# Patient Record
Sex: Male | Born: 1985 | Hispanic: No | Marital: Single | Smoking: Heavy tobacco smoker
Health system: Southern US, Community
[De-identification: ages and names within clinical notes are randomized; demographics above are authoritative.]

## PROBLEM LIST (undated history)

## (undated) DIAGNOSIS — F4001 Agoraphobia with panic disorder: Secondary | ICD-10-CM

## (undated) DIAGNOSIS — F419 Anxiety disorder, unspecified: Secondary | ICD-10-CM

## (undated) DIAGNOSIS — L409 Psoriasis, unspecified: Secondary | ICD-10-CM

## (undated) DIAGNOSIS — I10 Essential (primary) hypertension: Secondary | ICD-10-CM

## (undated) HISTORY — DX: Agoraphobia with panic disorder: F40.01

## (undated) HISTORY — DX: Psoriasis, unspecified: L40.9

## (undated) HISTORY — DX: Anxiety disorder, unspecified: F41.9

## (undated) HISTORY — DX: Essential (primary) hypertension: I10

---

## 2007-10-28 DIAGNOSIS — F4001 Agoraphobia with panic disorder: Secondary | ICD-10-CM

## 2007-10-28 HISTORY — DX: Agoraphobia with panic disorder: F40.01

## 2010-01-29 ENCOUNTER — Encounter: Admission: RE | Admit: 2010-01-29 | Discharge: 2010-01-29 | Payer: Self-pay | Admitting: Neurosurgery
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
## 2010-10-03 ENCOUNTER — Inpatient Hospital Stay (HOSPITAL_COMMUNITY): Admission: AC | Admit: 2010-10-03 | Discharge: 2010-01-04 | Payer: Self-pay

## 2011-01-20 LAB — CBC
HCT: 40.9 % (ref 39.0–52.0)
Hemoglobin: 14.7 g/dL (ref 13.0–17.0)
Hemoglobin: 16.3 g/dL (ref 13.0–17.0)
MCHC: 33.9 g/dL (ref 30.0–36.0)
MCHC: 34.7 g/dL (ref 30.0–36.0)
MCV: 89.9 fL (ref 78.0–100.0)
MCV: 90 fL (ref 78.0–100.0)
MCV: 90.6 fL (ref 78.0–100.0)
RBC: 4.51 MIL/uL (ref 4.22–5.81)
RBC: 4.83 MIL/uL (ref 4.22–5.81)
RDW: 13.4 % (ref 11.5–15.5)
RDW: 13.8 % (ref 11.5–15.5)
WBC: 10.5 10*3/uL (ref 4.0–10.5)
WBC: 11.6 10*3/uL — ABNORMAL HIGH (ref 4.0–10.5)

## 2011-01-20 LAB — CROSSMATCH

## 2011-01-20 LAB — COMPREHENSIVE METABOLIC PANEL
ALT: 20 U/L (ref 0–53)
Alkaline Phosphatase: 78 U/L (ref 39–117)
Chloride: 103 meq/L (ref 96–112)
Creatinine, Ser: 1.01 mg/dL (ref 0.4–1.5)
GFR calc non Af Amer: >60 mL/min (ref >60)
Potassium: 3.4 meq/L — ABNORMAL LOW (ref 3.5–5.1)
Sodium: 139 meq/L (ref 135–145)
Total Bilirubin: 0.3 mg/dL (ref 0.3–1.2)
Total Protein: 6.6 g/dL (ref 6.0–8.3)

## 2011-01-20 LAB — POCT I-STAT, CHEM 8
Calcium, Ion: 1.09 mmol/L — ABNORMAL LOW (ref 1.12–1.32)
Chloride: 105 meq/L (ref 96–112)
Glucose, Bld: 125 mg/dL — ABNORMAL HIGH (ref 70–99)
Hemoglobin: 16.3 g/dL (ref 13.0–17.0)
Sodium: 140 meq/L (ref 135–145)

## 2011-01-20 LAB — URINALYSIS, ROUTINE W REFLEX MICROSCOPIC
Bilirubin Urine: NEGATIVE
Hgb urine dipstick: NEGATIVE
Nitrite: NEGATIVE
pH: 6 (ref 5.0–8.0)

## 2011-01-20 LAB — BASIC METABOLIC PANEL
CO2: 27 mEq/L (ref 19–32)
Chloride: 106 mEq/L (ref 96–112)
Chloride: 107 mEq/L (ref 96–112)
Creatinine, Ser: 0.84 mg/dL (ref 0.4–1.5)
GFR calc Af Amer: >60 mL/min (ref >60)
GFR calc non Af Amer: >60 mL/min (ref >60)
GFR calc non Af Amer: >60 mL/min (ref >60)
Glucose, Bld: 112 mg/dL — ABNORMAL HIGH (ref 70–99)
Glucose, Bld: 134 mg/dL — ABNORMAL HIGH (ref 70–99)
Potassium: 3.9 mEq/L (ref 3.5–5.1)

## 2011-01-20 LAB — RAPID URINE DRUG SCREEN, HOSP PERFORMED
Benzodiazepines: POSITIVE — AB
Cocaine: NOT DETECTED
Opiates: NOT DETECTED

## 2011-01-20 LAB — URINE CULTURE: Colony Count: NO GROWTH

## 2011-01-20 LAB — DIFFERENTIAL
Eosinophils Absolute: 0 10*3/uL (ref 0.0–0.7)
Lymphs Abs: 0.8 10*3/uL (ref 0.7–4.0)
Monocytes Relative: 8 % (ref 3–12)
Neutrophils Relative %: 85 % — ABNORMAL HIGH (ref 43–77)

## 2011-01-20 LAB — PHENYTOIN LEVEL, TOTAL: Phenytoin Lvl: 17.7 ug/mL (ref 10.0–20.0)

## 2011-01-20 LAB — PROTIME-INR: Prothrombin Time: 13 s (ref 11.6–15.2)

## 2011-01-20 LAB — MRSA PCR SCREENING

## 2013-03-22 ENCOUNTER — Other Ambulatory Visit: Payer: Self-pay | Admitting: Nurse Practitioner

## 2013-03-23 NOTE — Telephone Encounter (Signed)
Last seen 12/16/12, last filled this day also, no future appts. Call into [facility] if approved

## 2013-03-23 NOTE — Telephone Encounter (Signed)
rx CALLED TO [facility]. 

## 2013-03-23 NOTE — Telephone Encounter (Signed)
Please call in xanax rx 

## 2013-03-26 ENCOUNTER — Other Ambulatory Visit: Payer: Self-pay | Admitting: Nurse Practitioner

## 2013-03-31 ENCOUNTER — Other Ambulatory Visit: Payer: Self-pay | Admitting: Nurse Practitioner

## 2013-05-12 ENCOUNTER — Other Ambulatory Visit: Payer: Self-pay | Admitting: Nurse Practitioner

## 2013-06-10 ENCOUNTER — Other Ambulatory Visit: Payer: Self-pay | Admitting: Nurse Practitioner

## 2013-06-13 NOTE — Telephone Encounter (Signed)
Filled 05/12/13  MMM Told needed to be seen before next refill  No upcoming appt scheduled

## 2013-06-15 ENCOUNTER — Other Ambulatory Visit: Payer: Self-pay | Admitting: Nurse Practitioner

## 2013-06-16 NOTE — Telephone Encounter (Signed)
Last seen 12/28/12  ACM

## 2013-08-24 ENCOUNTER — Other Ambulatory Visit: Payer: Self-pay | Admitting: Nurse Practitioner

## 2013-08-26 ENCOUNTER — Telehealth: Payer: Self-pay | Admitting: Nurse Practitioner

## 2013-08-26 MED ORDER — PAROXETINE HCL 40 MG PO TABS: 40.0000 mg | ORAL_TABLET | Freq: Every day | ORAL | Status: DC

## 2013-08-26 NOTE — Telephone Encounter (Signed)
done

## 2013-08-30 ENCOUNTER — Ambulatory Visit: Payer: Self-pay | Admitting: Family Medicine

## 2013-09-02 ENCOUNTER — Ambulatory Visit: Payer: Self-pay | Admitting: Family Medicine

## 2013-10-18 ENCOUNTER — Telehealth: Payer: Self-pay | Admitting: Nurse Practitioner

## 2013-10-19 MED ORDER — PAROXETINE HCL 40 MG PO TABS: 40.0000 mg | ORAL_TABLET | Freq: Every day | ORAL | Status: DC

## 2013-12-08 ENCOUNTER — Other Ambulatory Visit: Payer: Self-pay | Admitting: Nurse Practitioner

## 2013-12-08 NOTE — Telephone Encounter (Signed)
Refill

## 2013-12-09 NOTE — Telephone Encounter (Signed)
No visit in Epic. Please advise

## 2013-12-11 NOTE — Telephone Encounter (Signed)
ntbs for future refills 

## 2013-12-12 NOTE — Telephone Encounter (Signed)
Message left that patient ntbs

## 2014-02-03 ENCOUNTER — Other Ambulatory Visit: Payer: Self-pay | Admitting: *Deleted

## 2014-02-03 MED ORDER — PAROXETINE HCL 40 MG PO TABS
ORAL_TABLET | ORAL | Status: DC
Start: 2014-02-03 — End: 2014-03-21

## 2014-02-03 NOTE — Telephone Encounter (Signed)
Last OV on 12-28-12. Please advise

## 2014-02-05 ENCOUNTER — Other Ambulatory Visit: Payer: Self-pay | Admitting: Family Medicine

## 2014-03-20 ENCOUNTER — Telehealth: Payer: Self-pay | Admitting: *Deleted

## 2014-03-21 MED ORDER — PAROXETINE HCL 40 MG PO TABS
40.0000 mg | ORAL_TABLET | ORAL | Status: DC
Start: 2014-03-21 — End: 2014-03-24

## 2014-03-21 NOTE — Telephone Encounter (Signed)
Hasn't been seen in over one year. He has been out of Paxil for 2 days and is starting to feel the side effects of missing it. He has been stretching it out to make it last.  Patient does not have insurance and isn't sure how he'll pay for the visit. Discussed payment options with billing and with a 50% discount the cost will be $80. The only other option is for him to see how quickly the health department can see him since they have a sliding fee scale.  Patient was agreeable to the payment plan. I will send in 2 weeks worth of Paxil. Appt scheduled for 03/24/14. Patient understands that there will be no more negotiating refills if he is unable to be seen.

## 2014-03-24 ENCOUNTER — Ambulatory Visit (INDEPENDENT_AMBULATORY_CARE_PROVIDER_SITE_OTHER): Payer: Self-pay | Admitting: Family

## 2014-03-24 ENCOUNTER — Encounter: Payer: Self-pay | Admitting: Family

## 2014-03-24 VITALS — BP 139/91 | HR 96 | Temp 99.0°F | Ht 72.0 in | Wt 295.8 lb

## 2014-03-24 DIAGNOSIS — F32A Depression, unspecified: Secondary | ICD-10-CM

## 2014-03-24 DIAGNOSIS — F411 Generalized anxiety disorder: Secondary | ICD-10-CM | POA: Insufficient documentation

## 2014-03-24 DIAGNOSIS — F329 Major depressive disorder, single episode, unspecified: Secondary | ICD-10-CM | POA: Insufficient documentation

## 2014-03-24 DIAGNOSIS — F3289 Other specified depressive episodes: Secondary | ICD-10-CM

## 2014-03-24 MED ORDER — PAROXETINE HCL 40 MG PO TABS: 40.0000 mg | ORAL_TABLET | ORAL | Status: DC

## 2014-03-24 MED ORDER — ALPRAZOLAM 0.5 MG PO TABS: 0.5000 mg | ORAL_TABLET | Freq: Two times a day (BID) | ORAL | Status: DC | PRN

## 2014-03-24 MED ORDER — ALPRAZOLAM 0.5 MG PO TABS
0.5000 mg | ORAL_TABLET | Freq: Two times a day (BID) | ORAL | Status: DC | PRN
Start: 2014-03-24 — End: 2014-03-24

## 2014-03-24 NOTE — Patient Instructions (Signed)
Health Maintenance, Males A healthy lifestyle and preventative care can promote health and wellness.  Maintain regular health, dental, and eye exams.  Eat a healthy diet. Foods like vegetables, fruits, whole grains, low-fat dairy products, and lean protein foods contain the nutrients you need and are low in calories. Decrease your intake of foods high in solid fats, added sugars, and salt. Get information about a proper diet from your health care provider, if necessary.  Regular physical exercise is one of the most important things you can do for your health. Most adults should get at least 150 minutes of moderate-intensity exercise (any activity that increases your heart rate and causes you to sweat) each week. In addition, most adults need muscle-strengthening exercises on 2 or more days a week.   Maintain a healthy weight. The body mass index (BMI) is a screening tool to identify possible weight problems. It provides an estimate of body fat based on height and weight. Your health care provider can find your BMI and can help you achieve or maintain a healthy weight. For males 20 years and older:  A BMI below 18.5 is considered underweight.  A BMI of 18.5 to 24.9 is normal.  A BMI of 25 to 29.9 is considered overweight.  A BMI of 30 and above is considered obese.  Maintain normal blood lipids and cholesterol by exercising and minimizing your intake of saturated fat. Eat a balanced diet with plenty of fruits and vegetables. Blood tests for lipids and cholesterol should begin at age 20 and be repeated every 5 years. If your lipid or cholesterol levels are high, you are over 50, or you are at high risk for heart disease, you may need your cholesterol levels checked more frequently.Ongoing high lipid and cholesterol levels should be treated with medicines, if diet and exercise are not working.  If you smoke, find out from your health care provider how to quit. If you do not use tobacco, do not  start.  Lung cancer screening is recommended for adults aged 55 80 years who are at high risk for developing lung cancer because of a history of smoking. A yearly low-dose CT scan of the lungs is recommended for people who have at least a 30-pack-year history of smoking and are a current smoker or have quit within the past 15 years. A pack year of smoking is smoking an average of 1 pack of cigarettes a day for 1 year (for example, a 30-pack-year history of smoking could mean smoking 1 pack a day for 30 years or 2 packs a day for 15 years). Yearly screening should continue until the smoker has stopped smoking for at least 15 years. Yearly screening should be stopped for people who develop a health problem that would prevent them from having lung cancer treatment.  If you choose to drink alcohol, do not have more than 2 drinks per day. One drink is considered to be 12 oz (360 mL) of beer, 5 oz (150 mL) of wine, or 1.5 oz (45 mL) of liquor.  Avoid use of street drugs. Do not share needles with anyone. Ask for help if you need support or instructions about stopping the use of drugs.  High blood pressure causes heart disease and increases the risk of stroke. Blood pressure should be checked at least every 1 2 years. Ongoing high blood pressure should be treated with medicines if weight loss and exercise are not effective.  If you are 45 30 years old, ask your health   care provider if you should take aspirin to prevent heart disease.  Diabetes screening involves taking a blood sample to check your fasting blood sugar level. This should be done once every 3 years after age 45, if you are at a normal weight and without risk factors for diabetes. Testing should be considered at a younger age or be carried out more frequently if you are overweight and have at least 1 risk factor for diabetes.  Colorectal cancer can be detected and often prevented. Most routine colorectal cancer screening begins at the age of 50  and continues through age 75. However, your health care provider may recommend screening at an earlier age if you have risk factors for colon cancer. On a yearly basis, your health care provider may provide home test kits to check for hidden blood in the stool. A small camera at the end of a tube may be used to directly examine the colon (sigmoidoscopy or colonoscopy) to detect the earliest forms of colorectal cancer. Talk to your health care provider about this at age 50, when routine screening begins. A direct exam of the colon should be repeated every 5 10 years through age 75, unless early forms of pre-cancerous polyps or small growths are found.  People who are at an increased risk for hepatitis B should be screened for this virus. You are considered at high risk for hepatitis B if:  You were born in a country where hepatitis B occurs often. Talk with your health care provider about which countries are considered high-risk.  Your parents were born in a high-risk country and you have not received a shot to protect against hepatitis B (hepatitis B vaccine).  You have HIV or AIDS.  You use needles to inject street drugs.  You live with, or have sex with, someone who has hepatitis B.  You are a man who has sex with other men (MSM).  You get hemodialysis treatment.  You take certain medicines for conditions like cancer, organ transplantation, and autoimmune conditions.  Hepatitis C blood testing is recommended for all people born from 1945 through 1965 and any individual with known risk factors for hepatitis C.  Healthy men should no longer receive prostate-specific antigen (PSA) blood tests as part of routine cancer screening. Talk to your health care provider about prostate cancer screening.  Testicular cancer screening is not recommended for adolescents or adult males who have no symptoms. Screening includes self-exam, a health care provider exam, and other screening tests. Consult with  your health care provider about any symptoms you have or any concerns you have about testicular cancer.  Practice safe sex. Use condoms and avoid high-risk sexual practices to reduce the spread of sexually transmitted infections (STIs).  Use sunscreen. Apply sunscreen liberally and repeatedly throughout the day. You should seek shade when your shadow is shorter than you. Protect yourself by wearing long sleeves, pants, a wide-brimmed hat, and sunglasses year round, whenever you are outdoors.  Tell your health care provider of new moles or changes in moles, especially if there is a change in shape or color. Also tell your provider if a mole is larger than the size of a pencil eraser.  A one-time screening for abdominal aortic aneurysm (AAA) and surgical repair of large AAAs by ultrasound is recommended for men aged 65 75 years who are current or former smokers.  Stay current with your vaccines (immunizations). Document Released: 04/10/2008 Document Revised: 08/03/2013 Document Reviewed: 03/10/2011 [employer] Patient Information 2014 [employer]   DASH Diet The DASH diet stands for "Dietary Approaches to Stop Hypertension." It is a healthy eating plan that has been shown to reduce high blood pressure (hypertension) in as little as 14 days, while also possibly providing other significant health benefits. These other health benefits include reducing the risk of breast cancer after menopause and reducing the risk of type 2 diabetes, heart disease, colon cancer, and stroke. Health benefits also include weight loss and slowing kidney failure in patients with chronic kidney disease.  DIET GUIDELINES  Limit salt (sodium). Your diet should contain less than 1500 mg of sodium daily.  Limit refined or processed carbohydrates. Your diet should include mostly whole grains. Desserts and added sugars should be used sparingly.  Include small amounts of heart-healthy fats. These types of fats include nuts, oils,  and tub margarine. Limit saturated and trans fats. These fats have been shown to be harmful in the body. CHOOSING FOODS  The following food groups are based on a 2000 calorie diet. See your Registered Dietitian for individual calorie needs. Grains and Grain Products (6 to 8 servings daily)  Eat More Often: Whole-wheat bread, brown rice, whole-grain or wheat pasta, quinoa, popcorn without added fat or salt (air popped).  Eat Less Often: White bread, white pasta, white rice, cornbread. Vegetables (4 to 5 servings daily)  Eat More Often: Fresh, frozen, and canned vegetables. Vegetables may be raw, steamed, roasted, or grilled with a minimal amount of fat.  Eat Less Often/Avoid: Creamed or fried vegetables. Vegetables in a cheese sauce. Fruit (4 to 5 servings daily)  Eat More Often: All fresh, canned (in natural juice), or frozen fruits. Dried fruits without added sugar. One hundred percent fruit juice ( cup [237 mL] daily).  Eat Less Often: Dried fruits with added sugar. Canned fruit in light or heavy syrup. [employer], Fish, and Poultry (2 servings or less daily. One serving is 3 to 4 oz [85-114 g]).  Eat More Often: Ninety percent or leaner ground beef, tenderloin, sirloin. Round cuts of beef, chicken breast, [place] breast. All fish. Grill, bake, or broil your meat. Nothing should be fried.  Eat Less Often/Avoid: Fatty cuts of meat, [place], or chicken leg, thigh, or wing. Fried cuts of meat or fish. Dairy (2 to 3 servings)  Eat More Often: Low-fat or fat-free milk, low-fat plain or light yogurt, reduced-fat or part-skim cheese.  Eat Less Often/Avoid: Milk (whole, 2%).Whole milk yogurt. Full-fat cheeses. Nuts, Seeds, and Legumes (4 to 5 servings per week)  Eat More Often: All without added salt.  Eat Less Often/Avoid: Salted nuts and seeds, canned beans with added salt. Fats and Sweets (limited)  Eat More Often: Vegetable oils, tub margarines without trans fats, sugar-free gelatin.  Mayonnaise and salad dressings.  Eat Less Often/Avoid: Coconut oils, palm oils, butter, stick margarine, cream, half and half, cookies, candy, pie. FOR MORE INFORMATION The Dash Diet Eating Plan: www.dashdiet.org Document Released: 10/02/2011 Document Revised: 01/05/2012 Document Reviewed: 10/02/2011 [facility] Patient Information 2014 [place], [place].

## 2014-03-24 NOTE — Progress Notes (Signed)
Subjective:    Patient ID: Jeffery Peters, male    DOB: 1986/02/06, 28 y.o.   MRN: [id number]  Anxiety Presents for follow-up visit. Symptoms include dizziness, excessive worry, insomnia and nervous/anxious behavior. Patient reports no chest pain, confusion, dry mouth, irritability, palpitations, panic or suicidal ideas. Symptoms occur occasionally. The severity of symptoms is severe. The symptoms are aggravated by family issues. The quality of sleep is good.   His past medical history is significant for depression. There is no history of asthma or bipolar disorder. Past treatments include SSRIs and benzodiazephines. The treatment provided moderate relief. Compliance with prior treatments has been variable. Prior compliance problems include insurance issues.      Review of Systems  Constitutional: Negative for irritability.  HENT: Negative.   Respiratory: Negative.   Cardiovascular: Negative for chest pain and palpitations.  Genitourinary: Negative.   Neurological: Positive for dizziness.  Psychiatric/Behavioral: Negative for suicidal ideas and confusion. The patient is nervous/anxious and has insomnia.   All other systems reviewed and are negative.      Objective:   Physical Exam  Vitals reviewed. Constitutional: He is oriented to person, place, and time. He appears well-developed and well-nourished. No distress.  HENT:  Head: Normocephalic.  Right Ear: External ear normal.  Left Ear: External ear normal.  Nose: Nose normal.  Mouth/Throat: Oropharynx is clear and moist.  Eyes: Pupils are equal, round, and reactive to light. Right eye exhibits no discharge. Left eye exhibits no discharge.  Neck: Normal range of motion. Neck supple. No thyromegaly present.  Cardiovascular: Normal rate, regular rhythm, normal heart sounds and intact distal pulses.   No murmur heard. Pulmonary/Chest: Effort normal and breath sounds normal. No respiratory distress. He has no wheezes.  Abdominal:  Soft. Bowel sounds are normal. He exhibits no distension. There is no tenderness.  Musculoskeletal: Normal range of motion. He exhibits no edema and no tenderness.  Neurological: He is alert and oriented to person, place, and time. He has normal reflexes. No cranial nerve deficit.  Skin: Skin is warm and dry. No rash noted. No erythema.  Psychiatric: He has a normal mood and affect. His behavior is normal. Judgment and thought content normal.     BP 139/91  Pulse 96  Temp(Src) 99 F (37.2 C) (Oral)  Ht 6' (1.829 m)  Wt 295 lb 12.8 oz (134.174 kg)  BMI 40.11 kg/m2      Assessment & Plan:  1. GAD (generalized anxiety disorder)  2. Depression  Meds ordered this encounter  Medications  . ALPRAZolam (XANAX) 0.5 MG tablet    Sig: Take 1 tablet (0.5 mg total) by mouth 2 (two) times daily as needed for anxiety.    Dispense:  60 tablet    Refill:  0    Order Specific Question:  Supervising Provider    Answer:  Ernestina Penna [[id number]]  . PARoxetine (PAXIL) 40 MG tablet    Sig: Take 1 tablet (40 mg total) by mouth every morning. TAKE 1 TABLET BY MOUTH ONCE A DAY    Dispense:  30 tablet    Refill:  6    Order Specific Question:  Supervising Provider    Answer:  Deborra Medina      Continue all meds Pt refused labs at this time-Pt is self pay-States Jeffery get labs next visit Health Maintenance reviewed Diet and exercise encouraged Pt educated on importance of low NA diet and exercise, Pt given information on DASH diet- BP increased RTO  6 months  Evelina Dun, FNP

## 2014-05-08 ENCOUNTER — Other Ambulatory Visit: Payer: Self-pay | Admitting: Family

## 2014-05-09 NOTE — Telephone Encounter (Signed)
Script [facility]

## 2014-05-09 NOTE — Telephone Encounter (Signed)
Last refill 04/04/14. [facility] Last OV 03/24/14.

## 2014-11-29 ENCOUNTER — Other Ambulatory Visit: Payer: Self-pay | Admitting: Family

## 2014-12-08 ENCOUNTER — Telehealth: Payer: Self-pay | Admitting: Family

## 2014-12-08 MED ORDER — PAROXETINE HCL 40 MG PO TABS: 40.0000 mg | ORAL_TABLET | Freq: Every day | ORAL | Status: DC

## 2014-12-08 NOTE — Telephone Encounter (Signed)
Prescription sent to pharmacy.

## 2015-03-14 ENCOUNTER — Other Ambulatory Visit: Payer: Self-pay | Admitting: Family

## 2015-09-14 ENCOUNTER — Other Ambulatory Visit: Payer: Self-pay | Admitting: Family

## 2015-09-14 ENCOUNTER — Ambulatory Visit (INDEPENDENT_AMBULATORY_CARE_PROVIDER_SITE_OTHER): Payer: Self-pay | Admitting: Family

## 2015-09-14 ENCOUNTER — Encounter: Payer: Self-pay | Admitting: Family

## 2015-09-14 VITALS — BP 152/90 | HR 117 | Temp 97.9°F | Ht 72.0 in | Wt 275.0 lb

## 2015-09-14 DIAGNOSIS — F32A Depression, unspecified: Secondary | ICD-10-CM

## 2015-09-14 DIAGNOSIS — F329 Major depressive disorder, single episode, unspecified: Secondary | ICD-10-CM

## 2015-09-14 DIAGNOSIS — F411 Generalized anxiety disorder: Secondary | ICD-10-CM

## 2015-09-14 DIAGNOSIS — I10 Essential (primary) hypertension: Secondary | ICD-10-CM | POA: Insufficient documentation

## 2015-09-14 MED ORDER — LISINOPRIL 20 MG PO TABS: 20.0000 mg | ORAL_TABLET | Freq: Every day | ORAL | Status: DC

## 2015-09-14 MED ORDER — PAROXETINE HCL 40 MG PO TABS: 40.0000 mg | ORAL_TABLET | Freq: Every day | ORAL | Status: DC

## 2015-09-14 MED ORDER — ALPRAZOLAM 0.5 MG PO TABS: ORAL_TABLET | ORAL | Status: DC

## 2015-09-14 NOTE — Patient Instructions (Signed)
DASH Eating Plan °DASH stands for "Dietary Approaches to Stop Hypertension." The DASH eating plan is a healthy eating plan that has been shown to reduce high blood pressure (hypertension). Additional health benefits may include reducing the risk of type 2 diabetes mellitus, heart disease, and stroke. The DASH eating plan may also help with weight loss. °WHAT DO I NEED TO KNOW ABOUT THE DASH EATING PLAN? °For the DASH eating plan, you will follow these general guidelines: °· Choose foods with a percent daily value for sodium of less than 5% (as listed on the food label). °· Use salt-free seasonings or herbs instead of table salt or sea salt. °· Check with your health care provider or pharmacist before using salt substitutes. °· Eat lower-sodium products, often labeled as "lower sodium" or "no salt added." °· Eat fresh foods. °· Eat more vegetables, fruits, and low-fat dairy products. °· Choose whole grains. Look for the word "whole" as the first word in the ingredient list. °· Choose fish and skinless chicken or turkey more often than red meat. Limit fish, poultry, and meat to 6 oz (170 g) each day. °· Limit sweets, desserts, sugars, and sugary drinks. °· Choose heart-healthy fats. °· Limit cheese to 1 oz (28 g) per day. °· Eat more home-cooked food and less restaurant, buffet, and fast food. °· Limit fried foods. °· Cook foods using methods other than frying. °· Limit canned vegetables. If you do use them, rinse them well to decrease the sodium. °· When eating at a restaurant, ask that your food be prepared with less salt, or no salt if possible. °WHAT FOODS CAN I EAT? °Seek help from a dietitian for individual calorie needs. °Grains °Whole grain or whole wheat bread. Brown rice. Whole grain or whole wheat pasta. Quinoa, bulgur, and whole grain cereals. Low-sodium cereals. Corn or whole wheat flour tortillas. Whole grain cornbread. Whole grain crackers. Low-sodium crackers. °Vegetables °Fresh or frozen vegetables  (raw, steamed, roasted, or grilled). Low-sodium or reduced-sodium tomato and vegetable juices. Low-sodium or reduced-sodium tomato sauce and paste. Low-sodium or reduced-sodium canned vegetables.  °Fruits °All fresh, canned (in natural juice), or frozen fruits. °Meat and Other Protein Products °Ground beef (85% or leaner), grass-fed beef, or beef trimmed of fat. Skinless chicken or turkey. Ground chicken or turkey. Pork trimmed of fat. All fish and seafood. Eggs. Dried beans, peas, or lentils. Unsalted nuts and seeds. Unsalted canned beans. °Dairy °Low-fat dairy products, such as skim or 1% milk, 2% or reduced-fat cheeses, low-fat ricotta or cottage cheese, or plain low-fat yogurt. Low-sodium or reduced-sodium cheeses. °Fats and Oils °Tub margarines without trans fats. Light or reduced-fat mayonnaise and salad dressings (reduced sodium). Avocado. Safflower, olive, or canola oils. Natural peanut or almond butter. °Other °Unsalted popcorn and pretzels. °The items listed above may not be a complete list of recommended foods or beverages. Contact your dietitian for more options. °WHAT FOODS ARE NOT RECOMMENDED? °Grains °White bread. White pasta. White rice. Refined cornbread. Bagels and croissants. Crackers that contain trans fat. °Vegetables °Creamed or fried vegetables. Vegetables in a cheese sauce. Regular canned vegetables. Regular canned tomato sauce and paste. Regular tomato and vegetable juices. °Fruits °Dried fruits. Canned fruit in light or heavy syrup. Fruit juice. °Meat and Other Protein Products °Fatty cuts of meat. Ribs, chicken wings, bacon, sausage, bologna, salami, chitterlings, fatback, hot dogs, bratwurst, and packaged luncheon meats. Salted nuts and seeds. Canned beans with salt. °Dairy °Whole or 2% milk, cream, half-and-half, and cream cheese. Whole-fat or sweetened yogurt. Full-fat   cheeses or blue cheese. Nondairy creamers and whipped toppings. Processed cheese, cheese spreads, or cheese  curds. °Condiments °Onion and garlic salt, seasoned salt, table salt, and sea salt. Canned and packaged gravies. Worcestershire sauce. Tartar sauce. Barbecue sauce. Teriyaki sauce. Soy sauce, including reduced sodium. Steak sauce. Fish sauce. Oyster sauce. Cocktail sauce. Horseradish. Ketchup and mustard. Meat flavorings and tenderizers. Bouillon cubes. Hot sauce. Tabasco sauce. Marinades. Taco seasonings. Relishes. °Fats and Oils °Butter, stick margarine, lard, shortening, ghee, and bacon fat. Coconut, palm kernel, or palm oils. Regular salad dressings. °Other °Pickles and olives. Salted popcorn and pretzels. °The items listed above may not be a complete list of foods and beverages to avoid. Contact your dietitian for more information. °WHERE CAN I FIND MORE INFORMATION? °[employer]: www.nhlbi.nih.gov/health/health-topics/topics/dash/ °  °This information is not intended to replace advice given to you by your health care provider. Make sure you discuss any questions you have with your health care provider. °  °Document Released: 10/02/2011 Document Revised: 11/03/2014 Document Reviewed: 08/17/2013 °Elsevier Interactive Patient Education ©2016 [employer] ° °Hypertension °Hypertension, commonly called high blood pressure, is when the force of blood pumping through your arteries is too strong. Your arteries are the blood vessels that carry blood from your heart throughout your body. A blood pressure reading consists of a higher number over a lower number, such as 110/72. The higher number (systolic) is the pressure inside your arteries when your heart pumps. The lower number (diastolic) is the pressure inside your arteries when your heart relaxes. Ideally you want your blood pressure below 120/80. °Hypertension forces your heart to work harder to pump blood. Your arteries may become narrow or stiff. Having untreated or uncontrolled hypertension can cause heart attack, stroke, kidney  disease, and other problems. °RISK FACTORS °Some risk factors for high blood pressure are controllable. Others are not.  °Risk factors you cannot control include:  °· Race. You may be at higher risk if you are African American. °· Age. Risk increases with age. °· Gender. Men are at higher risk than women before age 45 years. After age 65, women are at higher risk than men. °Risk factors you can control include: °· Not getting enough exercise or physical activity. °· Being overweight. °· Getting too much fat, sugar, calories, or salt in your diet. °· Drinking too much alcohol. °SIGNS AND SYMPTOMS °Hypertension does not usually cause signs or symptoms. Extremely high blood pressure (hypertensive crisis) may cause headache, anxiety, shortness of breath, and nosebleed. °DIAGNOSIS °To check if you have hypertension, your health care provider will measure your blood pressure while you are seated, with your arm held at the level of your heart. It should be measured at least twice using the same arm. Certain conditions can cause a difference in blood pressure between your right and left arms. A blood pressure reading that is higher than normal on one occasion does not mean that you need treatment. If it is not clear whether you have high blood pressure, you may be asked to return on a different day to have your blood pressure checked again. Or, you may be asked to monitor your blood pressure at home for 1 or more weeks. °TREATMENT °Treating high blood pressure includes making lifestyle changes and possibly taking medicine. Living a healthy lifestyle can help lower high blood pressure. You may need to change some of your habits. °Lifestyle changes may include: °· Following the DASH diet. This diet is high in fruits, vegetables, and whole   grains. It is low in salt, red meat, and added sugars. °· Keep your sodium intake below 2,300 mg per day. °· Getting at least 30-45 minutes of aerobic exercise at least 4 times per  week. °· Losing weight if necessary. °· Not smoking. °· Limiting alcoholic beverages. °· Learning ways to reduce stress. °Your health care provider may prescribe medicine if lifestyle changes are not enough to get your blood pressure under control, and if one of the following is true: °· You are 18-59 years of age and your systolic blood pressure is above 140. °· You are 60 years of age or older, and your systolic blood pressure is above 150. °· Your diastolic blood pressure is above 90. °· You have diabetes, and your systolic blood pressure is over 140 or your diastolic blood pressure is over 90. °· You have kidney disease and your blood pressure is above 140/90. °· You have heart disease and your blood pressure is above 140/90. °Your personal target blood pressure may vary depending on your medical conditions, your age, and other factors. °HOME CARE INSTRUCTIONS °· Have your blood pressure rechecked as directed by your health care provider.   °· Take medicines only as directed by your health care provider. Follow the directions carefully. Blood pressure medicines must be taken as prescribed. The medicine does not work as well when you skip doses. Skipping doses also puts you at risk for problems. °· Do not smoke.   °· Monitor your blood pressure at home as directed by your health care provider.  °SEEK MEDICAL CARE IF:  °· You think you are having a reaction to medicines taken. °· You have recurrent headaches or feel dizzy. °· You have swelling in your ankles. °· You have trouble with your vision. °SEEK IMMEDIATE MEDICAL CARE IF: °· You develop a severe headache or confusion. °· You have unusual weakness, numbness, or feel faint. °· You have severe chest or abdominal pain. °· You vomit repeatedly. °· You have trouble breathing. °MAKE SURE YOU:  °· Understand these instructions. °· Will watch your condition. °· Will get help right away if you are not doing well or get worse. °  °This information is not intended to  replace advice given to you by your health care provider. Make sure you discuss any questions you have with your health care provider. °  °Document Released: 10/13/2005 Document Revised: 02/27/2015 Document Reviewed: 08/05/2013 °Elsevier Interactive Patient Education ©2016 Elsevier Inc. ° °

## 2015-09-14 NOTE — Progress Notes (Addendum)
Subjective:    Patient ID: Jeffery Peters, male    DOB: [birth year], 29 y.o.   MRN: [id number]  Pt presents to the office today for chronic follow up.  Anxiety Presents for follow-up visit. Onset was more than 5 years ago. The problem has been waxing and waning. Symptoms include depressed mood, excessive worry, irritability, nervous/anxious behavior and panic. Patient reports no chest pain. Symptoms occur occasionally. The severity of symptoms is moderate. The symptoms are aggravated by medication.   His past medical history is significant for anxiety/panic attacks and depression. Past treatments include benzodiazephines and SSRIs. Prior compliance problems include insurance issues.  Depression      The patient presents with depression.  This is a chronic problem.  The current episode started more than 1 year ago.   The onset quality is gradual.   The problem occurs intermittently.  The problem has been waxing and waning since onset.  Associated symptoms include irritable.  Associated symptoms include no helplessness, no hopelessness and not sad.  Past treatments include SSRIs - Selective serotonin reuptake inhibitors.  Compliance with treatment is good.  Past medical history includes anxiety and depression.     Pertinent negatives include no thyroid problem. Hypertension This is a new problem. The current episode started more than 1 month ago. The problem has been waxing and waning since onset. Associated symptoms include anxiety. Pertinent negatives include no chest pain, malaise/fatigue or peripheral edema. Risk factors for coronary artery disease include male gender, obesity and stress. Past treatments include nothing. The current treatment provides no improvement. There is no history of kidney disease, CAD/MI, CVA, heart failure or a thyroid problem. There is no history of sleep apnea.      Review of Systems  Constitutional: Positive for irritability. Negative for malaise/fatigue.  HENT:  Negative.   Respiratory: Negative.   Cardiovascular: Negative.  Negative for chest pain.  Gastrointestinal: Negative.   Endocrine: Negative.   Genitourinary: Negative.   Musculoskeletal: Negative.   Neurological: Negative.   Hematological: Negative.   Psychiatric/Behavioral: Positive for depression. The patient is nervous/anxious.   All other systems reviewed and are negative.      Objective:   Physical Exam  Constitutional: He is oriented to person, place, and time. He appears well-developed and well-nourished. He is irritable. No distress.  HENT:  Head: Normocephalic.  Right Ear: External ear normal.  Left Ear: External ear normal.  Nose: Nose normal.  Mouth/Throat: Oropharynx is clear and moist.  Eyes: Pupils are equal, round, and reactive to light. Right eye exhibits no discharge. Left eye exhibits no discharge.  Neck: Normal range of motion. Neck supple. No thyromegaly present.  Cardiovascular: Regular rhythm, normal heart sounds and intact distal pulses.   No murmur heard. Tachycardia   Pulmonary/Chest: Effort normal and breath sounds normal. No respiratory distress. He has no wheezes.  Abdominal: Soft. Bowel sounds are normal. He exhibits no distension. There is no tenderness.  Musculoskeletal: Normal range of motion. He exhibits no edema or tenderness.  Neurological: He is alert and oriented to person, place, and time. He has normal reflexes. No cranial nerve deficit.  Skin: Skin is warm and dry. No rash noted. No erythema.  Psychiatric: He has a normal mood and affect. His behavior is normal. Judgment and thought content normal.  Vitals reviewed.     BP 152/90 mmHg  Pulse 117  Temp(Src) 97.9 F (36.6 C) (Oral)  Ht 6' (1.829 m)  Wt 275 lb (124.739 kg)  BMI 37.29  kg/m2     Assessment & Plan:  1. Depression - BMP8+EGFR - ALPRAZolam (XANAX) 0.5 MG tablet; TAKE 1 TABLET TWICE A DAY AS NEEDED FOR ANXIETY  Dispense: 60 tablet; Refill: 3 - PARoxetine (PAXIL) 40  MG tablet; Take 1 tablet (40 mg total) by mouth daily.  Dispense: 90 tablet; Refill: 1  2. GAD (generalized anxiety disorder) - BMP8+EGFR - ALPRAZolam (XANAX) 0.5 MG tablet; TAKE 1 TABLET TWICE A DAY AS NEEDED FOR ANXIETY  Dispense: 60 tablet; Refill: 3 - PARoxetine (PAXIL) 40 MG tablet; Take 1 tablet (40 mg total) by mouth daily.  Dispense: 90 tablet; Refill: 1  3. Essential hypertension -Pt started on lisinopril today -Daily blood pressure log given with instructions on how to fill out and told to bring to next visit -Dash diet information given -Exercise encouraged - Stress Management  -Continue current meds -RTO in 2 weeks  - lisinopril (PRINIVIL,ZESTRIL) 20 MG tablet; Take 1 tablet (20 mg total) by mouth daily.  Dispense: 90 tablet; Refill: 3 - BMP8+EGFR   Continue all meds Labs pending Health Maintenance reviewed Diet and exercise encouraged RTO 2 weeks to recheck HTN  Evelina Dun, FNP

## 2015-09-15 LAB — BMP8+EGFR
BUN/Creatinine Ratio: 16 (ref 8–19)
BUN: 14 mg/dL (ref 6–20)
CALCIUM: 10.2 mg/dL (ref 8.7–10.2)
CO2: 22 mmol/L (ref 18–29)
Chloride: 102 mmol/L (ref 97–106)
Creatinine, Ser: 0.88 mg/dL (ref 0.76–1.27)
GFR calc Af Amer: 132 mL/min/{1.73_m2} (ref >59)
GFR, EST NON AFRICAN AMERICAN: 114 mL/min/{1.73_m2} (ref >59)
Glucose: 115 mg/dL — ABNORMAL HIGH (ref 65–99)
POTASSIUM: 4.5 mmol/L (ref 3.5–5.2)
Sodium: 144 mmol/L (ref 136–144)

## 2015-09-17 NOTE — Telephone Encounter (Signed)
Last seen 09/14/15 Jeffery BonitoChristy  If approved route to nurse to call into [facility]

## 2015-09-17 NOTE — Telephone Encounter (Signed)
Script for xanax called to [facility] voice mail. 

## 2015-12-06 ENCOUNTER — Other Ambulatory Visit: Payer: Self-pay | Admitting: Family

## 2016-03-22 ENCOUNTER — Other Ambulatory Visit: Payer: Self-pay | Admitting: Family

## 2016-03-31 ENCOUNTER — Ambulatory Visit (INDEPENDENT_AMBULATORY_CARE_PROVIDER_SITE_OTHER): Payer: BLUE CROSS/BLUE SHIELD | Admitting: Family

## 2016-03-31 ENCOUNTER — Encounter: Payer: Self-pay | Admitting: Family

## 2016-03-31 VITALS — BP 143/86 | HR 100 | Temp 97.0°F | Ht 72.0 in | Wt 278.0 lb

## 2016-03-31 DIAGNOSIS — Z136 Encounter for screening for cardiovascular disorders: Secondary | ICD-10-CM

## 2016-03-31 DIAGNOSIS — Z13220 Encounter for screening for lipoid disorders: Secondary | ICD-10-CM | POA: Diagnosis not present

## 2016-03-31 DIAGNOSIS — I10 Essential (primary) hypertension: Secondary | ICD-10-CM

## 2016-03-31 DIAGNOSIS — F329 Major depressive disorder, single episode, unspecified: Secondary | ICD-10-CM | POA: Diagnosis not present

## 2016-03-31 DIAGNOSIS — Z114 Encounter for screening for human immunodeficiency virus [HIV]: Secondary | ICD-10-CM | POA: Diagnosis not present

## 2016-03-31 DIAGNOSIS — F32A Depression, unspecified: Secondary | ICD-10-CM

## 2016-03-31 DIAGNOSIS — F411 Generalized anxiety disorder: Secondary | ICD-10-CM | POA: Diagnosis not present

## 2016-03-31 DIAGNOSIS — IMO0001 Reserved for inherently not codable concepts without codable children: Secondary | ICD-10-CM

## 2016-03-31 DIAGNOSIS — E669 Obesity, unspecified: Secondary | ICD-10-CM | POA: Insufficient documentation

## 2016-03-31 MED ORDER — LISINOPRIL 20 MG PO TABS: 20.0000 mg | ORAL_TABLET | Freq: Every day | ORAL | Status: DC

## 2016-03-31 MED ORDER — ALPRAZOLAM 0.5 MG PO TABS: 0.5000 mg | ORAL_TABLET | Freq: Two times a day (BID) | ORAL | Status: DC | PRN

## 2016-03-31 MED ORDER — PAROXETINE HCL 40 MG PO TABS
40.0000 mg | ORAL_TABLET | Freq: Every day | ORAL | Status: DC
Start: 2016-03-31 — End: 2016-10-10

## 2016-03-31 NOTE — Patient Instructions (Signed)

## 2016-03-31 NOTE — Progress Notes (Signed)
Subjective:    Patient ID: Jeffery Peters, male    DOB: 1986/04/12, 30 y.o.   MRN: [id number]  Pt presents to the office today for chronic follow up. PT's BP is elevated today, but states he never started his lisinopril. PT states he will start today.  Anxiety Presents for follow-up visit. Onset was more than 5 years ago. The problem has been waxing and waning. Symptoms include depressed mood, excessive worry and nervous/anxious behavior. Patient reports no chest pain, irritability or panic. Symptoms occur occasionally. The severity of symptoms is moderate. The symptoms are aggravated by medication.   His past medical history is significant for anxiety/panic attacks and depression. Past treatments include benzodiazephines and SSRIs. Prior compliance problems include insurance issues.  Depression      The patient presents with depression.  This is a chronic problem.  The current episode started more than 1 year ago.   The onset quality is gradual.   The problem occurs intermittently.  The problem has been waxing and waning since onset.  Associated symptoms include irritable.  Associated symptoms include no helplessness, no hopelessness and not sad.  Past treatments include SSRIs - Selective serotonin reuptake inhibitors.  Compliance with treatment is good.  Past medical history includes anxiety and depression.     Pertinent negatives include no thyroid problem. Hypertension This is a chronic problem. The current episode started more than 1 month ago. The problem has been waxing and waning since onset. The problem is uncontrolled. Associated symptoms include anxiety. Pertinent negatives include no chest pain, malaise/fatigue or peripheral edema. Risk factors for coronary artery disease include male gender, obesity and stress. Past treatments include nothing. The current treatment provides no improvement. There is no history of kidney disease, CAD/MI, CVA, heart failure or a thyroid problem. There is no  history of sleep apnea.      Review of Systems  Constitutional: Negative for malaise/fatigue and irritability.  HENT: Negative.   Respiratory: Negative.   Cardiovascular: Negative.  Negative for chest pain.  Gastrointestinal: Negative.   Endocrine: Negative.   Genitourinary: Negative.   Musculoskeletal: Negative.   Neurological: Negative.   Hematological: Negative.   Psychiatric/Behavioral: Positive for depression. The patient is nervous/anxious.   All other systems reviewed and are negative.      Objective:   Physical Exam  Constitutional: He is oriented to person, place, and time. He appears well-developed and well-nourished. He is irritable. No distress.  HENT:  Head: Normocephalic.  Right Ear: External ear normal.  Left Ear: External ear normal.  Nose: Nose normal.  Mouth/Throat: Oropharynx is clear and moist.  Eyes: Pupils are equal, round, and reactive to light. Right eye exhibits no discharge. Left eye exhibits no discharge.  Neck: Normal range of motion. Neck supple. No thyromegaly present.  Cardiovascular: Regular rhythm, normal heart sounds and intact distal pulses.   No murmur heard.    Pulmonary/Chest: Effort normal and breath sounds normal. No respiratory distress. He has no wheezes.  Abdominal: Soft. Bowel sounds are normal. He exhibits no distension. There is no tenderness.  Musculoskeletal: Normal range of motion. He exhibits no edema or tenderness.  Neurological: He is alert and oriented to person, place, and time.  Skin: Skin is warm and dry. No rash noted. No erythema.  Psychiatric: He has a normal mood and affect. His behavior is normal. Judgment and thought content normal.  Vitals reviewed.     BP 143/86 mmHg  Pulse 100  Temp(Src) 97 F (36.1 C) (Oral)  Ht 6' (1.829 m)  Wt 278 lb (126.1 kg)  BMI 37.70 kg/m2     Assessment & Plan:  1. Essential hypertension -Pt told to start lisinopril 20 mg today -Dash diet information given -Exercise  encouraged - Stress Management  -Continue current meds -RTO in 2 weeks - CMP14+EGFR - lisinopril (PRINIVIL,ZESTRIL) 20 MG tablet; Take 1 tablet (20 mg total) by mouth daily.  Dispense: 90 tablet; Refill: 3  2. GAD (generalized anxiety disorder) - CMP14+EGFR - PARoxetine (PAXIL) 40 MG tablet; Take 1 tablet (40 mg total) by mouth daily.  Dispense: 90 tablet; Refill: 1 - ALPRAZolam (XANAX) 0.5 MG tablet; Take 1 tablet (0.5 mg total) by mouth 2 (two) times daily as needed for anxiety.  Dispense: 60 tablet; Refill: 2  3. Depression - CMP14+EGFR - PARoxetine (PAXIL) 40 MG tablet; Take 1 tablet (40 mg total) by mouth daily.  Dispense: 90 tablet; Refill: 1  4. Obesity (BMI 30-39.9) - CMP14+EGFR  5. Encounter for cholesteral screening for cardiovascular disease - CMP14+EGFR - Lipid panel  6. Screening for HIV (human immunodeficiency virus) - CMP14+EGFR - HIV antibody   Continue all meds Labs pending Health Maintenance reviewed Diet and exercise encouraged RTO 2 weeks to recheck HTN  Evelina Dun, FNP

## 2016-04-01 LAB — CMP14+EGFR
A/G RATIO: 2 (ref 1.2–2.2)
ALT: 88 IU/L — AB (ref 0–44)
AST: 117 IU/L — AB (ref 0–40)
Albumin: 4.8 g/dL (ref 3.5–5.5)
Alkaline Phosphatase: 77 IU/L (ref 39–117)
BILIRUBIN TOTAL: 0.3 mg/dL (ref 0.0–1.2)
BUN/Creatinine Ratio: 17 (ref 9–20)
BUN: 11 mg/dL (ref 6–20)
CHLORIDE: 95 mmol/L — AB (ref 96–106)
CO2: 21 mmol/L (ref 18–29)
Calcium: 9.7 mg/dL (ref 8.7–10.2)
Creatinine, Ser: 0.66 mg/dL — ABNORMAL LOW (ref 0.76–1.27)
GFR calc non Af Amer: 129 mL/min/{1.73_m2} (ref >59)
GFR, EST AFRICAN AMERICAN: 149 mL/min/{1.73_m2} (ref >59)
Globulin, Total: 2.4 g/dL (ref 1.5–4.5)
Glucose: 92 mg/dL (ref 65–99)
POTASSIUM: 4.4 mmol/L (ref 3.5–5.2)
SODIUM: 138 mmol/L (ref 134–144)
Total Protein: 7.2 g/dL (ref 6.0–8.5)

## 2016-04-01 LAB — HIV ANTIBODY (ROUTINE TESTING W REFLEX): HIV SCREEN 4TH GENERATION: NONREACTIVE

## 2016-04-01 LAB — LIPID PANEL
Chol/HDL Ratio: 5.6 ratio units — ABNORMAL HIGH (ref 0.0–5.0)
Cholesterol, Total: 229 mg/dL — ABNORMAL HIGH (ref 100–199)
HDL: 41 mg/dL (ref >39)
LDL Calculated: 124 mg/dL — ABNORMAL HIGH (ref 0–99)
TRIGLYCERIDES: 321 mg/dL — AB (ref 0–149)
VLDL Cholesterol Cal: 64 mg/dL — ABNORMAL HIGH (ref 5–40)

## 2016-04-05 LAB — HEPATITIS PANEL, ACUTE
HEP A IGM: NEGATIVE
HEP B C IGM: NEGATIVE
HEP B S AG: NEGATIVE
Hep C Virus Ab: <0.1 s/co ratio (ref 0.0–0.9)

## 2016-04-05 LAB — SPECIMEN STATUS REPORT

## 2016-04-07 ENCOUNTER — Other Ambulatory Visit: Payer: Self-pay | Admitting: Family

## 2016-04-07 DIAGNOSIS — R748 Abnormal levels of other serum enzymes: Secondary | ICD-10-CM

## 2016-04-15 ENCOUNTER — Encounter (INDEPENDENT_AMBULATORY_CARE_PROVIDER_SITE_OTHER): Payer: Self-pay | Admitting: Internal Medicine

## 2016-04-15 ENCOUNTER — Other Ambulatory Visit: Payer: Self-pay | Admitting: Family

## 2016-04-15 ENCOUNTER — Ambulatory Visit (HOSPITAL_COMMUNITY)
Admission: RE | Admit: 2016-04-15 | Discharge: 2016-04-15 | Disposition: A | Discharge status: Home / Self Care | Payer: BLUE CROSS/BLUE SHIELD | Source: Ambulatory Visit | Attending: Family | Admitting: Family

## 2016-04-15 DIAGNOSIS — R748 Abnormal levels of other serum enzymes: Secondary | ICD-10-CM | POA: Diagnosis present

## 2016-04-15 DIAGNOSIS — K760 Fatty (change of) liver, not elsewhere classified: Secondary | ICD-10-CM

## 2016-05-01 ENCOUNTER — Ambulatory Visit (INDEPENDENT_AMBULATORY_CARE_PROVIDER_SITE_OTHER): Payer: BLUE CROSS/BLUE SHIELD | Admitting: Internal Medicine

## 2016-05-01 ENCOUNTER — Encounter (INDEPENDENT_AMBULATORY_CARE_PROVIDER_SITE_OTHER): Payer: Self-pay | Admitting: Internal Medicine

## 2016-05-01 ENCOUNTER — Encounter: Payer: Self-pay | Admitting: *Deleted

## 2016-07-08 ENCOUNTER — Other Ambulatory Visit: Payer: Self-pay | Admitting: Family

## 2016-07-08 DIAGNOSIS — F411 Generalized anxiety disorder: Secondary | ICD-10-CM

## 2016-07-09 NOTE — Telephone Encounter (Signed)
Please review and advise.

## 2016-07-09 NOTE — Telephone Encounter (Signed)
Neysa BonitoChristy will be back tomorrow, please forward this to her.

## 2016-07-10 NOTE — Telephone Encounter (Signed)
rx called into pharmacy

## 2016-07-30 ENCOUNTER — Telehealth: Payer: Self-pay | Admitting: Family

## 2016-07-30 NOTE — Telephone Encounter (Signed)
appt scheduled Pt notified 

## 2016-07-31 ENCOUNTER — Encounter: Payer: Self-pay | Admitting: Family

## 2016-07-31 ENCOUNTER — Ambulatory Visit (INDEPENDENT_AMBULATORY_CARE_PROVIDER_SITE_OTHER): Payer: BLUE CROSS/BLUE SHIELD | Admitting: Family

## 2016-07-31 VITALS — BP 138/79 | HR 100 | Temp 97.5°F | Ht 72.0 in | Wt 269.2 lb

## 2016-07-31 DIAGNOSIS — L309 Dermatitis, unspecified: Secondary | ICD-10-CM

## 2016-07-31 MED ORDER — TRIAMCINOLONE ACETONIDE 0.1 % EX CREA: 1.0000 "application " | TOPICAL_CREAM | Freq: Two times a day (BID) | CUTANEOUS | 0 refills | Status: DC

## 2016-07-31 MED ORDER — PREDNISONE 10 MG (21) PO TBPK: ORAL_TABLET | ORAL | 0 refills | Status: DC

## 2016-07-31 NOTE — Progress Notes (Signed)
   Subjective:    Patient ID: Jeffery BonnetSean A Kaser, male    DOB: 1986-08-28, 30 y.o.   MRN: [id number]  Pt presents to the office today with a recurrent rash on forehead, right elbow, neck, and lower back. 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 Pt states he is working at [employer]  Rash  This is a recurrent problem. The current episode started more than 1 month ago. The problem has been gradually worsening since onset. The affected locations include the head, right elbow and back. The rash is characterized by itchiness, redness, scaling and dryness. He was exposed to nothing. Pertinent negatives include no congestion, diarrhea, fatigue, joint pain, shortness of breath or sore throat. Past treatments include antibiotic cream. The treatment provided no relief.      Review of Systems  Constitutional: Negative for fatigue.  HENT: Negative for congestion and sore throat.   Respiratory: Negative for shortness of breath.   Gastrointestinal: Negative for diarrhea.  Musculoskeletal: Negative for joint pain.  Skin: Positive for rash.       Objective:   Physical Exam  Constitutional: He appears well-developed and well-nourished.  Cardiovascular: Normal rate, regular rhythm, normal heart sounds and intact distal pulses.   Pulmonary/Chest: Effort normal and breath sounds normal.  Skin: Rash noted. There is erythema.  Erythemas scaling rash on forehead, left neck, right elbow     BP 138/79   Pulse 100   Temp 97.5 F (36.4 C) (Oral)   Ht 6' (1.829 m)   Wt 269 lb 3.2 oz (122.1 kg)   BMI 36.51 kg/m       Assessment & Plan:  1. Eczema, unspecified type -Do not scratch -Apply thick ointment after shower -Avoid hot showers,  -RTO prn - predniSONE (STERAPRED UNI-PAK 21 TAB) 10 MG (21) TBPK tablet; Use as directed  Dispense: 21 tablet; Refill: 0 - triamcinolone cream (KENALOG) 0.1 %; Apply 1 application  topically 2 (two) times daily.  Dispense: 30 g; Refill: 0  Jannifer Rodneyhristy Eulene Pekar, FNP

## 2016-07-31 NOTE — Patient Instructions (Signed)
Eczema Eczema, also called atopic dermatitis, is a skin disorder that causes inflammation of the skin. It causes a red rash and dry, scaly skin. The skin becomes very itchy. Eczema is generally worse during the cooler winter months and often improves with the warmth of summer. Eczema usually starts showing signs in infancy. Some children outgrow eczema, but it may last through adulthood.  CAUSES  The exact cause of eczema is not known, but it appears to run in families. People with eczema often have a family history of eczema, allergies, asthma, or hay fever. Eczema is not contagious. Flare-ups of the condition may be caused by:   Contact with something you are sensitive or allergic to.   Stress. SIGNS AND SYMPTOMS  Dry, scaly skin.   Red, itchy rash.   Itchiness. This may occur before the skin rash and may be very intense.  DIAGNOSIS  The diagnosis of eczema is usually made based on symptoms and medical history. TREATMENT  Eczema cannot be cured, but symptoms usually can be controlled with treatment and other strategies. A treatment plan might include:  Controlling the itching and scratching.   Use over-the-counter antihistamines as directed for itching. This is especially useful at night when the itching tends to be worse.   Use over-the-counter steroid creams as directed for itching.   Avoid scratching. Scratching makes the rash and itching worse. It may also result in a skin infection (impetigo) due to a break in the skin caused by scratching.   Keeping the skin well moisturized with creams every day. This will seal in moisture and help prevent dryness. Lotions that contain alcohol and water should be avoided because they can dry the skin.   Limiting exposure to things that you are sensitive or allergic to (allergens).   Recognizing situations that cause stress.   Developing a plan to manage stress.  HOME CARE INSTRUCTIONS   Only take over-the-counter or  prescription medicines as directed by your health care provider.   Do not use anything on the skin without checking with your health care provider.   Keep baths or showers short (5 minutes) in warm (not hot) water. Use mild cleansers for bathing. These should be unscented. You may add nonperfumed bath oil to the bath water. It is best to avoid soap and bubble bath.   Immediately after a bath or shower, when the skin is still damp, apply a moisturizing ointment to the entire body. This ointment should be a petroleum ointment. This will seal in moisture and help prevent dryness. The thicker the ointment, the better. These should be unscented.   Keep fingernails cut short. Children with eczema may need to wear soft gloves or mittens at night after applying an ointment.   Dress in clothes made of cotton or cotton blends. Dress lightly, because heat increases itching.   A child with eczema should stay away from anyone with fever blisters or cold sores. The virus that causes fever blisters (herpes simplex) can cause a serious skin infection in children with eczema. SEEK MEDICAL CARE IF:   Your itching interferes with sleep.   Your rash gets worse or is not better within 1 week after starting treatment.   You see pus or soft yellow scabs in the rash area.   You have a fever.   You have a rash flare-up after contact with someone who has fever blisters.    This information is not intended to replace advice given to you by your health care   provider. Make sure you discuss any questions you have with your health care provider.   Document Released: 10/10/2000 Document Revised: 08/03/2013 Document Reviewed: 05/16/2013 Elsevier Interactive Patient Education 2016 Elsevier Inc.  

## 2016-08-26 ENCOUNTER — Telehealth: Payer: Self-pay | Admitting: Family

## 2016-08-26 DIAGNOSIS — R21 Rash and other nonspecific skin eruption: Secondary | ICD-10-CM

## 2016-08-26 NOTE — Telephone Encounter (Signed)
Please review dermatology referral and try to get scheduled very soon.  Patient is having issues at work due to his eczema.

## 2016-08-26 NOTE — Telephone Encounter (Signed)
Work note given to pt [facility] is in [employer]

## 2016-09-12 ENCOUNTER — Other Ambulatory Visit: Payer: Self-pay | Admitting: Family

## 2016-09-12 DIAGNOSIS — F411 Generalized anxiety disorder: Secondary | ICD-10-CM

## 2016-09-12 NOTE — Telephone Encounter (Signed)
[facility]

## 2016-09-15 NOTE — Telephone Encounter (Signed)
Refill called to [facility] 

## 2016-10-10 ENCOUNTER — Other Ambulatory Visit: Payer: Self-pay | Admitting: Family

## 2016-10-10 DIAGNOSIS — F329 Major depressive disorder, single episode, unspecified: Secondary | ICD-10-CM

## 2016-10-10 DIAGNOSIS — F411 Generalized anxiety disorder: Secondary | ICD-10-CM

## 2016-10-10 DIAGNOSIS — F32A Depression, unspecified: Secondary | ICD-10-CM

## 2016-10-22 ENCOUNTER — Ambulatory Visit (INDEPENDENT_AMBULATORY_CARE_PROVIDER_SITE_OTHER): Payer: BLUE CROSS/BLUE SHIELD | Admitting: Family

## 2016-10-22 ENCOUNTER — Encounter: Payer: Self-pay | Admitting: Family

## 2016-10-22 VITALS — BP 139/90 | HR 108 | Temp 97.1°F | Ht 72.0 in | Wt 267.0 lb

## 2016-10-22 DIAGNOSIS — L409 Psoriasis, unspecified: Secondary | ICD-10-CM

## 2016-10-22 MED ORDER — CALCIPOTRIENE 0.005 % EX CREA: TOPICAL_CREAM | Freq: Two times a day (BID) | CUTANEOUS | 0 refills | Status: DC

## 2016-10-22 MED ORDER — TRIAMCINOLONE ACETONIDE 0.1 % EX CREA: 1.0000 "application " | TOPICAL_CREAM | Freq: Two times a day (BID) | CUTANEOUS | 0 refills | Status: DC

## 2016-10-22 NOTE — Progress Notes (Signed)
   Subjective:    Patient ID: Jeffery Peters, male    DOB: [birth year], 30 y.o.   MRN: [id number]  PT presents to the office today with recurrent skin rash. Pt was seen by dermatologists and was told he had psoriasis.  Pt was given creams but can not remember the names.  Rash  This is a recurrent problem. The current episode started more than 1 year ago. The problem has been gradually worsening since onset. The affected locations include the scalp, head, face, chest, left arm, right arm, abdomen and back. The rash is characterized by dryness, itchiness, redness, scaling and swelling. Past treatments include anti-itch cream, moisturizer, oral steroids and topical steroids. The treatment provided mild relief.      Review of Systems  Skin: Positive for rash.  All other systems reviewed and are negative.      Objective:   Physical Exam  Constitutional: He is oriented to person, place, and time. He appears well-developed and well-nourished. No distress.  Cardiovascular: Normal rate, regular rhythm, normal heart sounds and intact distal pulses.   No murmur heard. Pulmonary/Chest: Effort normal and breath sounds normal. No respiratory distress. He has no wheezes.  Abdominal: Soft. Bowel sounds are normal. He exhibits no distension. There is no tenderness.  Musculoskeletal: Normal range of motion. He exhibits no edema or tenderness.  Neurological: He is alert and oriented to person, place, and time. He has normal reflexes. No cranial nerve deficit.  Skin: Skin is warm and dry. Rash noted. No erythema.  psoriasis rash with erythemas plaques on face, scalp, bilateral arms, back, chest, and legs.  Psychiatric: He has a normal mood and affect. His behavior is normal. Judgment and thought content normal.  Vitals reviewed.     BP 139/90   Pulse (!) 108   Temp 97.1 F (36.2 C) (Oral)   Ht 6' (1.829 m)   Wt 267 lb (121.1 kg)   BMI 36.21 kg/m      Assessment & Plan:  1. Psoriasis -Do not  scratch - - calcipotriene (DOVONOX) 0.005 % cream; Apply topically 2 (two) times daily.  Dispense: 60 g; Refill: 0 - triamcinolone cream (KENALOG) 0.1 %; Apply 1 application topically 2 (two) times daily.  Dispense: 30 g; Refill: 0 - Ambulatory referral to Dermatology  Jannifer Rodneyhristy Kellyn Mansfield, FNP

## 2016-10-22 NOTE — Patient Instructions (Signed)
Psoriasis Introduction Psoriasis is a long-term (chronic) condition of skin inflammation. It occurs because your immune system causes skin cells to form too quickly. As a result, too many skin cells grow and create raised, red patches (plaques) that look silvery on your skin. Plaques may appear anywhere on your body. They can be any size or shape. Psoriasis can come and go. The condition varies from mild to very severe. It cannot be passed from one person to another (not contagious). What are the causes? The cause of psoriasis is not known, but certain factors can make the condition worse. These include:  Damage or trauma to the skin, such as cuts, scrapes, sunburn, and dryness.  Lack of sunlight.  Certain medicines.  Alcohol.  Tobacco use.  Stress.  Infections caused by bacteria or viruses. What increases the risk? This condition is more likely to develop in:  People with a family history of psoriasis.  People who are Caucasian.  People who are between the ages of 15-30 and 50-60 years old. What are the signs or symptoms? There are five different types of psoriasis. You can have more than one type of psoriasis during your life. Types are:  Plaque.  Guttate.  Inverse.  Pustular.  Erythrodermic. Each type of psoriasis has different symptoms.  Plaque psoriasis symptoms include red, raised plaques with a silvery white coating (scale). These plaques may be itchy. Your nails may be pitted and crumbly or fall off.  Guttate psoriasis symptoms include small red spots that often show up on your trunk, arms, and legs. These spots may develop after you have been sick, especially with strep throat.  Inverse psoriasis symptoms include plaques in your underarm area, under your breasts, or on your genitals, groin, or buttocks.  Pustular psoriasis symptoms include pus-filled bumps that are painful, red, and swollen on the palms of your hands or the soles of your feet. You also may  feel exhausted, feverish, weak, or have no appetite.  Erythrodermic psoriasis symptoms include bright red skin that may look burned. You may have a fast heartbeat and a body temperature that is too high or too low. You may be itchy or in pain. How is this diagnosed? Your health care provider may suspect psoriasis based on your symptoms and family history. Your health care provider will also do a physical exam. This may include a procedure to remove a tissue sample (biopsy) for testing. You may also be referred to a health care provider who specializes in skin diseases (dermatologist). How is this treated? There is no cure for this condition, but treatment can help manage it. Goals of treatment include:  Helping your skin heal.  Reducing itching and inflammation.  Slowing the growth of new skin cells.  Helping your immune system respond better to your skin. Treatment varies, depending on the severity of your condition. Treatment may include:  Creams or ointments.  Ultraviolet ray exposure (light therapy). This may include natural sunlight or light therapy in a medical office.  Medicines (systemic therapy). These medicines can help your body better manage skin cell turnover and inflammation. They may be used along with light therapy or ointments. You may also get antibiotic medicines if you have an infection. Follow these instructions at home: Skin Care  Moisturize your skin as needed. Only use moisturizers that have been approved by your health care provider.  Apply cool compresses to the affected areas.  Do not scratch your skin. Lifestyle  Do not use tobacco products. This includes cigarettes,   chewing tobacco, and e-cigarettes. If you need help quitting, ask your health care provider.  Drink little or no alcohol.  Try techniques for stress reduction, such as meditation or yoga.  Get exposure to the sun as told by your health care provider. Do not get sunburned.  Consider  joining a psoriasis support group. Medicines  Take or use over-the-counter and prescription medicines only as told by your health care provider.  If you were prescribed an antibiotic, take or use it as told by your health care provider. Do not stop taking the antibiotic even if your condition starts to improve. General instructions  Keep a journal to help track what triggers an outbreak. Try to avoid any triggers.  See a counselor or social worker if feelings of sadness, frustration, and hopelessness about your condition are interfering with your work and relationships.  Keep all follow-up visits as told by your health care provider. This is important. Contact a health care provider if:  Your pain gets worse.  You have increasing redness or warmth in the affected areas.  You have new or worsening pain or stiffness in your joints.  Your nails start to break easily or pull away from the nail bed.  You have a fever.  You feel depressed. This information is not intended to replace advice given to you by your health care provider. Make sure you discuss any questions you have with your health care provider. Document Released: 10/10/2000 Document Revised: 03/20/2016 Document Reviewed: 02/28/2015  2017 Elsevier  

## 2016-10-29 ENCOUNTER — Encounter: Payer: Self-pay | Admitting: Family Medicine

## 2016-10-29 ENCOUNTER — Ambulatory Visit (INDEPENDENT_AMBULATORY_CARE_PROVIDER_SITE_OTHER): Payer: Self-pay | Admitting: Family Medicine

## 2016-10-29 VITALS — BP 139/87 | HR 106 | Temp 97.6°F | Ht 72.0 in | Wt 269.4 lb

## 2016-10-29 DIAGNOSIS — L409 Psoriasis, unspecified: Secondary | ICD-10-CM

## 2016-10-29 MED ORDER — COAL TAR EXTRACT 10 % EX SHAM
1.0000 | MEDICATED_SHAMPOO | Freq: Every day | CUTANEOUS | 10 refills | Status: DC
Start: 2016-10-29 — End: 2018-06-04

## 2016-10-29 MED ORDER — PREDNISONE 20 MG PO TABS: ORAL_TABLET | ORAL | 0 refills | Status: DC

## 2016-10-29 NOTE — Progress Notes (Signed)
   BP 139/87   Pulse (!) 106   Temp 97.6 F (36.4 C) (Oral)   Ht 6' (1.829 m)   Wt 269 lb 6 oz (122.2 kg)   BMI 36.53 kg/m    Subjective:    Patient ID: Jeffery Peters, male    DOB: December 05, 1985, 31 y.o.   MRN: [id number]  HPI: Jeffery Peters is a 31 y.o. male presenting on 10/29/2016 for Rash all over body (patient has been seen here several times, has upcoming appointment with dermatologist on 11/06/16.  reports rash is painful in several areas.  patient reports his supervisor at [employer] wanted him to be seen to make sure it is not shingles)   HPI Rash Patient has a rash all over body that is been worsening over the past 2 months. He has had this previously but usually clears up with just a steroid dose and then is gone but now is not clearing up. He has tried many topical steroids and most recently saw a dermatologist who said it was psoriasis and told him to continue with the topical treatments. He denies any fevers or chills. He did have drainage out of one of the sites previously but not currently.  Relevant past medical, surgical, family and social history reviewed and updated as indicated. Interim medical history since our last visit reviewed. Allergies and medications reviewed and updated.  Review of Systems  Constitutional: Negative for chills and fever.  Respiratory: Negative for shortness of breath and wheezing.   Cardiovascular: Negative for chest pain and leg swelling.  Musculoskeletal: Negative for back pain and gait problem.  Skin: Positive for rash.  All other systems reviewed and are negative.   Per HPI unless specifically indicated above      Objective:    BP 139/87   Pulse (!) 106   Temp 97.6 F (36.4 C) (Oral)   Ht 6' (1.829 m)   Wt 269 lb 6 oz (122.2 kg)   BMI 36.53 kg/m   Wt Readings from Last 3 Encounters:  10/29/16 269 lb 6 oz (122.2 kg)  10/22/16 267 lb (121.1 kg)  07/31/16 269 lb 3.2 oz (122.1 kg)    Physical Exam  Constitutional: He is  oriented to person, place, and time. He appears well-developed and well-nourished. No distress.  Eyes: Conjunctivae are normal. Right eye exhibits no discharge. No scleral icterus.  Musculoskeletal: Normal range of motion. He exhibits no edema.  Neurological: He is alert and oriented to person, place, and time. Coordination normal.  Skin: Skin is warm and dry. Rash noted. Rash is papular (Scaly silvery plaque, all consistent with plaque psoriasis). He is not diaphoretic.  Psychiatric: He has a normal mood and affect. His behavior is normal.  Nursing note and vitals reviewed.              Assessment & Plan:   Problem List Items Addressed This Visit      Musculoskeletal and Integument   Psoriasis - Primary   Relevant Medications   predniSONE (DELTASONE) 20 MG tablet   Coal Tar Extract 10 % SHAM       Follow up plan: Return if symptoms worsen or fail to improve.  Counseling provided for all of the vaccine components No orders of the defined types were placed in this encounter.   Arville CareJoshua Calise Dunckel, MD [facility] 10/29/2016, 2:16 PM

## 2016-11-06 ENCOUNTER — Other Ambulatory Visit: Payer: Self-pay | Admitting: Family

## 2016-11-11 ENCOUNTER — Ambulatory Visit (INDEPENDENT_AMBULATORY_CARE_PROVIDER_SITE_OTHER): Payer: Self-pay | Admitting: *Deleted

## 2016-11-11 DIAGNOSIS — Z23 Encounter for immunization: Secondary | ICD-10-CM

## 2016-11-11 NOTE — Progress Notes (Signed)
Pt given PPD Ordered Dr Bethanie DickerJoshua Butler  [facility]

## 2016-11-14 LAB — TB SKIN TEST
INDURATION: 0 mm
TB Skin Test: NEGATIVE

## 2016-11-23 ENCOUNTER — Other Ambulatory Visit: Payer: Self-pay | Admitting: Family

## 2016-11-23 DIAGNOSIS — F411 Generalized anxiety disorder: Secondary | ICD-10-CM

## 2016-11-24 NOTE — Telephone Encounter (Signed)
TC to pt to inform [employer] for refill of controlled substance. Explained new guidelines on refilling these mediations & OV guidelines. Pt has been seen in last 2 months but this has been for problem issues only.

## 2016-11-24 NOTE — Telephone Encounter (Signed)
Patient [employer] for follow up and lab work  

## 2016-12-25 ENCOUNTER — Encounter: Payer: Self-pay | Admitting: Family

## 2016-12-25 ENCOUNTER — Encounter (INDEPENDENT_AMBULATORY_CARE_PROVIDER_SITE_OTHER): Payer: Self-pay

## 2016-12-25 ENCOUNTER — Ambulatory Visit (INDEPENDENT_AMBULATORY_CARE_PROVIDER_SITE_OTHER): Payer: Self-pay | Admitting: Family

## 2016-12-25 VITALS — BP 144/95 | HR 109 | Temp 98.8°F | Ht 72.0 in | Wt 273.0 lb

## 2016-12-25 DIAGNOSIS — E669 Obesity, unspecified: Secondary | ICD-10-CM

## 2016-12-25 DIAGNOSIS — L409 Psoriasis, unspecified: Secondary | ICD-10-CM

## 2016-12-25 DIAGNOSIS — F411 Generalized anxiety disorder: Secondary | ICD-10-CM

## 2016-12-25 DIAGNOSIS — F332 Major depressive disorder, recurrent severe without psychotic features: Secondary | ICD-10-CM

## 2016-12-25 DIAGNOSIS — I10 Essential (primary) hypertension: Secondary | ICD-10-CM

## 2016-12-25 MED ORDER — ALPRAZOLAM 0.5 MG PO TABS: ORAL_TABLET | ORAL | 5 refills | Status: DC

## 2016-12-25 MED ORDER — LISINOPRIL 20 MG PO TABS: 20.0000 mg | ORAL_TABLET | Freq: Every day | ORAL | 3 refills | Status: DC

## 2016-12-25 MED ORDER — PAROXETINE HCL 40 MG PO TABS: 40.0000 mg | ORAL_TABLET | Freq: Every day | ORAL | 0 refills | Status: DC

## 2016-12-25 NOTE — Patient Instructions (Signed)
Psoriasis Psoriasis is a long-term (chronic) condition of skin inflammation. It occurs because your immune system causes skin cells to form too quickly. As a result, too many skin cells grow and create raised, red patches (plaques) that look silvery on your skin. Plaques may appear anywhere on your body. They can be any size or shape. Psoriasis can come and go. The condition varies from mild to very severe. It cannot be passed from one person to another (not contagious). What are the causes? The cause of psoriasis is not known, but certain factors can make the condition worse. These include:  Damage or trauma to the skin, such as cuts, scrapes, sunburn, and dryness.  Lack of sunlight.  Certain medicines.  Alcohol.  Tobacco use.  Stress.  Infections caused by bacteria or viruses. What increases the risk? This condition is more likely to develop in:  People with a family history of psoriasis.  People who are Caucasian.  People who are between the ages of 15-30 and 50-60 years old. What are the signs or symptoms? There are five different types of psoriasis. You can have more than one type of psoriasis during your life. Types are:  Plaque.  Guttate.  Inverse.  Pustular.  Erythrodermic. Each type of psoriasis has different symptoms.  Plaque psoriasis symptoms include red, raised plaques with a silvery white coating (scale). These plaques may be itchy. Your nails may be pitted and crumbly or fall off.  Guttate psoriasis symptoms include small red spots that often show up on your trunk, arms, and legs. These spots may develop after you have been sick, especially with strep throat.  Inverse psoriasis symptoms include plaques in your underarm area, under your breasts, or on your genitals, groin, or buttocks.  Pustular psoriasis symptoms include pus-filled bumps that are painful, red, and swollen on the palms of your hands or the soles of your feet. You also may feel exhausted,  feverish, weak, or have no appetite.  Erythrodermic psoriasis symptoms include bright red skin that may look burned. You may have a fast heartbeat and a body temperature that is too high or too low. You may be itchy or in pain. How is this diagnosed? Your health care provider may suspect psoriasis based on your symptoms and family history. Your health care provider will also do a physical exam. This may include a procedure to remove a tissue sample (biopsy) for testing. You may also be referred to a health care provider who specializes in skin diseases (dermatologist). How is this treated? There is no cure for this condition, but treatment can help manage it. Goals of treatment include:  Helping your skin heal.  Reducing itching and inflammation.  Slowing the growth of new skin cells.  Helping your immune system respond better to your skin. Treatment varies, depending on the severity of your condition. Treatment may include:  Creams or ointments.  Ultraviolet ray exposure (light therapy). This may include natural sunlight or light therapy in a medical office.  Medicines (systemic therapy). These medicines can help your body better manage skin cell turnover and inflammation. They may be used along with light therapy or ointments. You may also get antibiotic medicines if you have an infection. Follow these instructions at home: Skin Care   Moisturize your skin as needed. Only use moisturizers that have been approved by your health care provider.  Apply cool compresses to the affected areas.  Do not scratch your skin. Lifestyle    Do not use tobacco products. This   includes cigarettes, chewing tobacco, and e-cigarettes. If you need help quitting, ask your health care provider.  Drink little or no alcohol.  Try techniques for stress reduction, such as meditation or yoga.  Get exposure to the sun as told by your health care provider. Do not get sunburned.  Consider joining a  psoriasis support group. Medicines   Take or use over-the-counter and prescription medicines only as told by your health care provider.  If you were prescribed an antibiotic, take or use it as told by your health care provider. Do not stop taking the antibiotic even if your condition starts to improve. General instructions   Keep a journal to help track what triggers an outbreak. Try to avoid any triggers.  See a counselor or social worker if feelings of sadness, frustration, and hopelessness about your condition are interfering with your work and relationships.  Keep all follow-up visits as told by your health care provider. This is important. Contact a health care provider if:  Your pain gets worse.  You have increasing redness or warmth in the affected areas.  You have new or worsening pain or stiffness in your joints.  Your nails start to break easily or pull away from the nail bed.  You have a fever.  You feel depressed. This information is not intended to replace advice given to you by your health care provider. Make sure you discuss any questions you have with your health care provider. Document Released: 10/10/2000 Document Revised: 03/20/2016 Document Reviewed: 02/28/2015 Elsevier Interactive Patient Education  2017 [employer]vier Inc.  

## 2016-12-25 NOTE — Progress Notes (Signed)
Subjective:    Patient ID: Jeffery Peters, male    DOB: 1986/10/21, 31 y.o.   MRN: [id number]  Pt presents to the office today for chronic follow up. Pt is followed by Dermatologists every month for psoriasis. Anxiety  Presents for follow-up visit. Onset was more than 5 years ago. The problem has been waxing and waning. Symptoms include depressed mood, excessive worry, nervous/anxious behavior and restlessness. Patient reports no chest pain, irritability or panic. Symptoms occur occasionally. The severity of symptoms is moderate. The symptoms are aggravated by medication.   His past medical history is significant for anxiety/panic attacks and depression. Past treatments include benzodiazephines and SSRIs. Prior compliance problems include insurance issues.  Depression       The patient presents with depression.  This is a chronic problem.  The current episode started more than 1 year ago.   The onset quality is gradual.   The problem occurs intermittently.  The problem has been waxing and waning since onset.  Associated symptoms include irritable and restlessness.  Associated symptoms include no helplessness, no hopelessness and not sad.  Past treatments include SSRIs - Selective serotonin reuptake inhibitors.  Compliance with treatment is good.  Past medical history includes anxiety and depression.     Pertinent negatives include no thyroid problem. Hypertension  This is a chronic problem. The current episode started more than 1 month ago. The problem has been waxing and waning since onset. The problem is uncontrolled. Associated symptoms include anxiety. Pertinent negatives include no chest pain, malaise/fatigue or peripheral edema. Risk factors for coronary artery disease include male gender, obesity and stress. Past treatments include nothing. The current treatment provides no improvement. There is no history of kidney disease, CAD/MI, CVA or heart failure. There is no history of sleep apnea or a  thyroid problem.      Review of Systems  Constitutional: Negative for irritability and malaise/fatigue.  HENT: Negative.   Respiratory: Negative.   Cardiovascular: Negative.  Negative for chest pain.  Gastrointestinal: Negative.   Endocrine: Negative.   Genitourinary: Negative.   Musculoskeletal: Negative.   Neurological: Negative.   Hematological: Negative.   Psychiatric/Behavioral: Positive for depression. The patient is nervous/anxious.   All other systems reviewed and are negative.      Objective:   Physical Exam  Constitutional: He is oriented to person, place, and time. He appears well-developed and well-nourished. He is irritable. No distress.  HENT:  Head: Normocephalic.  Right Ear: External ear normal.  Left Ear: External ear normal.  Nose: Nose normal.  Mouth/Throat: Oropharynx is clear and moist.  Eyes: Pupils are equal, round, and reactive to light. Right eye exhibits no discharge. Left eye exhibits no discharge.  Neck: Normal range of motion. Neck supple. No thyromegaly present.  Cardiovascular: Regular rhythm, normal heart sounds and intact distal pulses.   No murmur heard.    Pulmonary/Chest: Effort normal and breath sounds normal. No respiratory distress. He has no wheezes.  Abdominal: Soft. Bowel sounds are normal. He exhibits no distension. There is no tenderness.  Musculoskeletal: Normal range of motion. He exhibits no edema or tenderness.  Neurological: He is alert and oriented to person, place, and time.  Skin: Skin is warm and dry. Rash noted. No erythema.  Generalized erythemas plaque rash on face, arms, legs, back, and chest   Psychiatric: He has a normal mood and affect. His behavior is normal. Judgment and thought content normal.  Vitals reviewed.   BP (!) 144/95   Pulse Marland Kitchen)  109   Temp 98.8 F (37.1 C) (Oral)   Ht 6' (1.829 m)   Wt 273 lb (123.8 kg)   BMI 37.03 kg/m      Assessment & Plan:  1. Essential hypertension -Pt told to  restart lisinopril - lisinopril (PRINIVIL,ZESTRIL) 20 MG tablet; Take 1 tablet (20 mg total) by mouth daily.  Dispense: 90 tablet; Refill: 3 - CMP14+EGFR  2. Psoriasis -Keep derm appt - CMP14+EGFR  3. Severe episode of recurrent major depressive disorder, without psychotic features ([place]) - PARoxetine (PAXIL) 40 MG tablet; Take 1 tablet (40 mg total) by mouth daily.  Dispense: 90 tablet; Refill: 0 - CMP14+EGFR  4. GAD (generalized anxiety disorder) - PARoxetine (PAXIL) 40 MG tablet; Take 1 tablet (40 mg total) by mouth daily.  Dispense: 90 tablet; Refill: 0 - ALPRAZolam (XANAX) 0.5 MG tablet; TAKE 1 TABLET BY MOUTH TWICE A DAY AS NEEDED FOR ANXIETY  Dispense: 45 tablet; Refill: 5 - CMP14+EGFR  5. Obesity (BMI 30-39.9) - CMP14+EGFR   Continue all meds Labs pending Health Maintenance reviewed Diet and exercise encouraged RTO 6 months   Evelina Dun, FNP

## 2017-03-30 ENCOUNTER — Other Ambulatory Visit: Payer: Self-pay | Admitting: Family

## 2017-03-30 DIAGNOSIS — F332 Major depressive disorder, recurrent severe without psychotic features: Secondary | ICD-10-CM

## 2017-03-30 DIAGNOSIS — F411 Generalized anxiety disorder: Secondary | ICD-10-CM

## 2017-04-15 IMAGING — US US ABDOMEN LIMITED
1 series · 14 of 25 positions shown · non-contrast
Comparison: None.

CLINICAL DATA: Elevated LFTs.

EXAM:
US ABDOMEN LIMITED - RIGHT UPPER QUADRANT

[Series 1: us abdomen limited · 0.24mm/px · 14 of 41 slices shown]
[im 1/41]
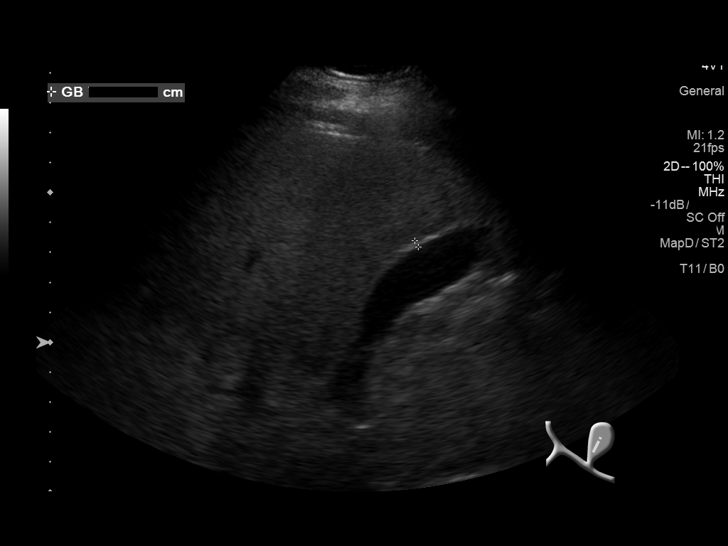
[im 4/41]
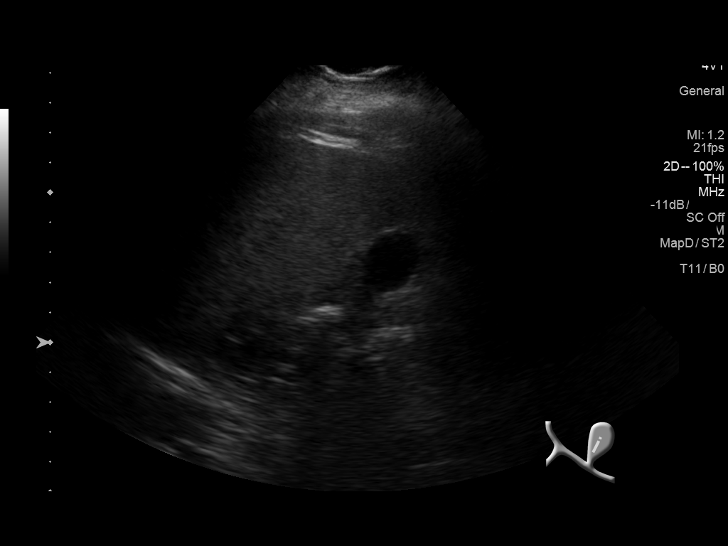
[im 7/41]
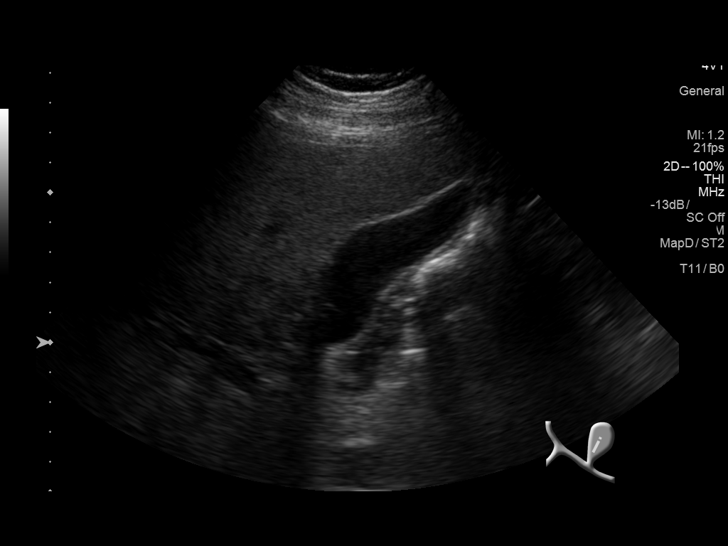
[im 11/41]
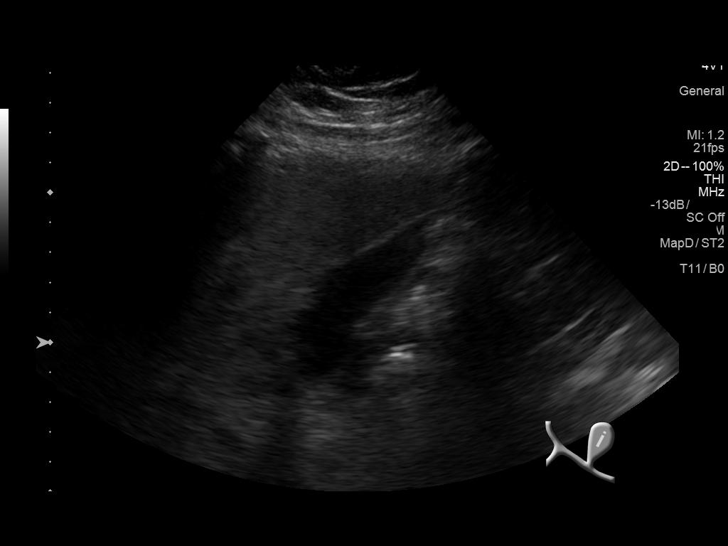
[im 14/41]
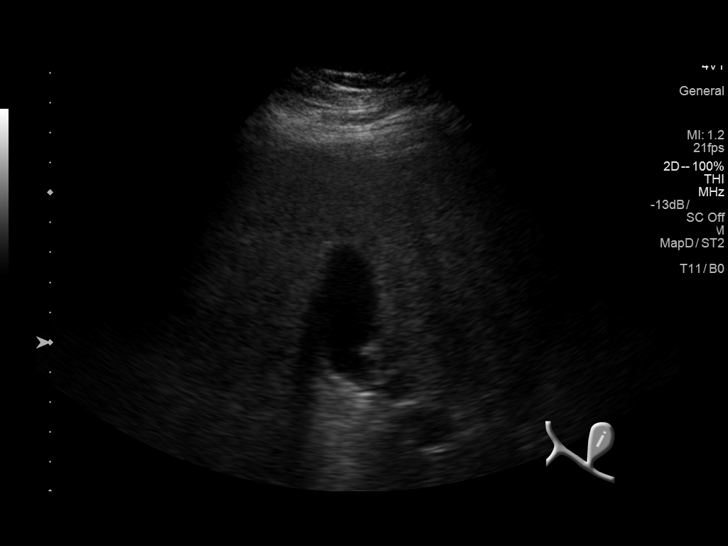
[im 16/41]
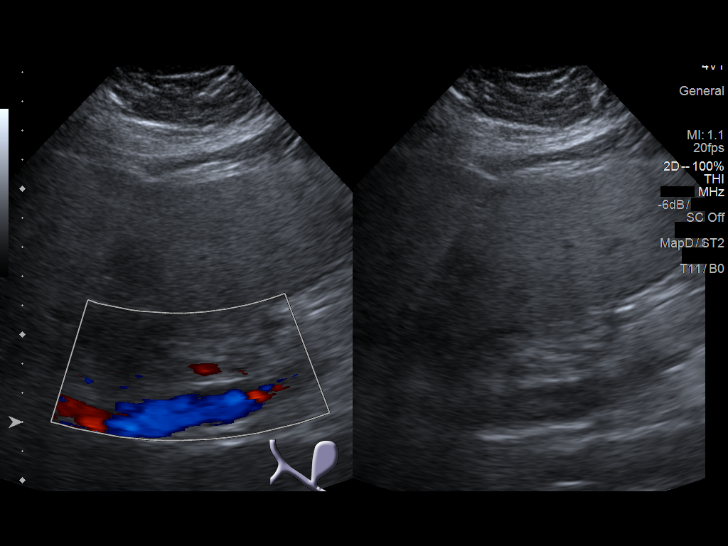
[im 19/41]
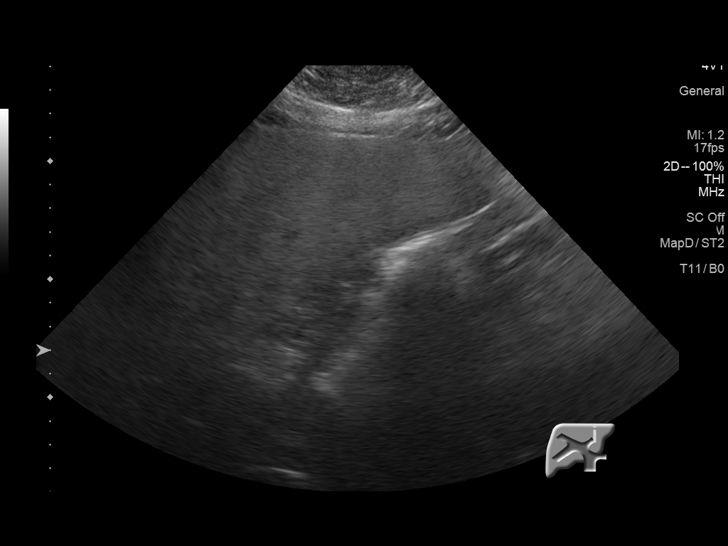
[im 22/41]
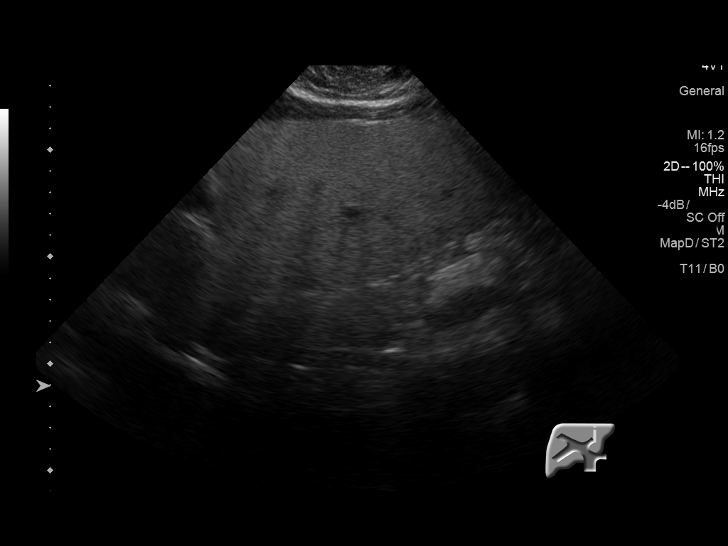
[im 26/41]
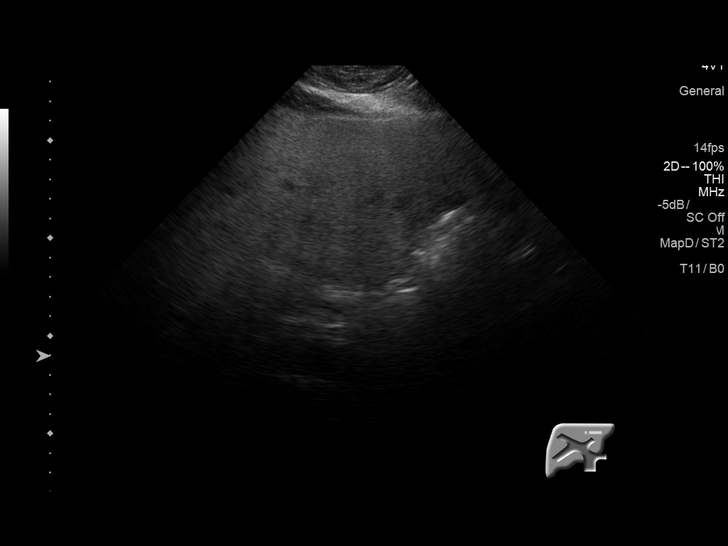
[im 27/41]
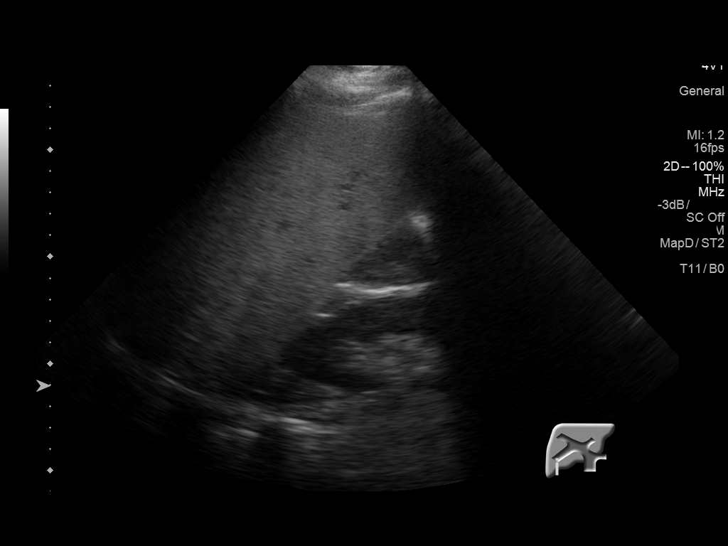
[im 31/41]
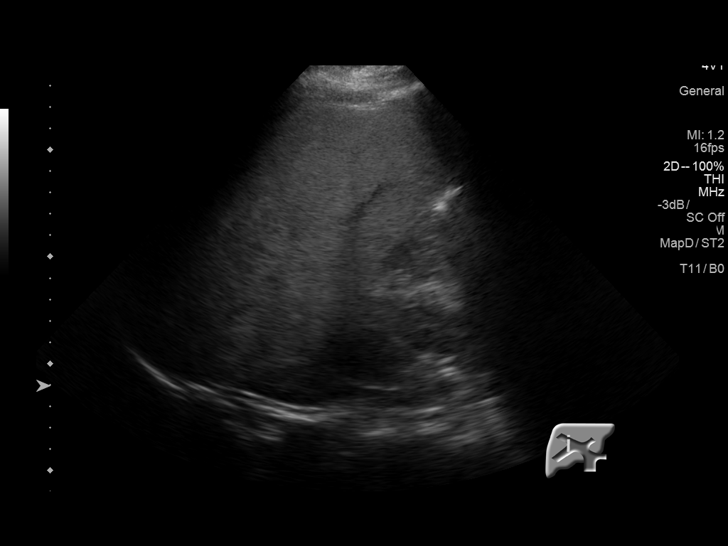
[im 34/41]
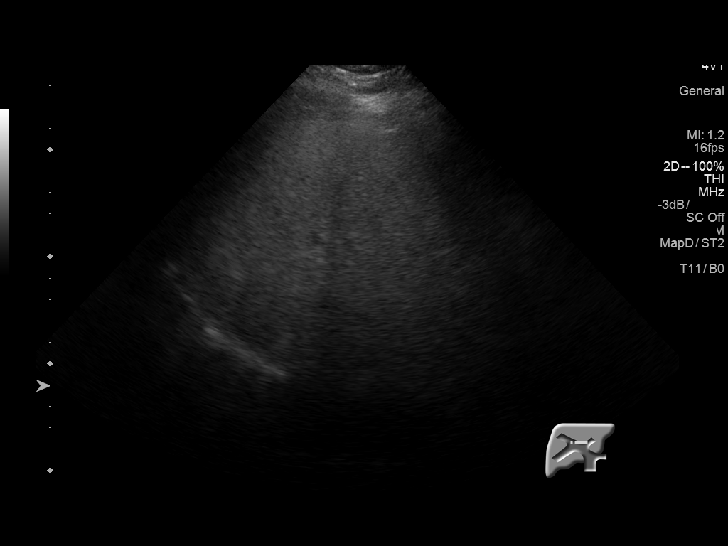
[im 37/41]
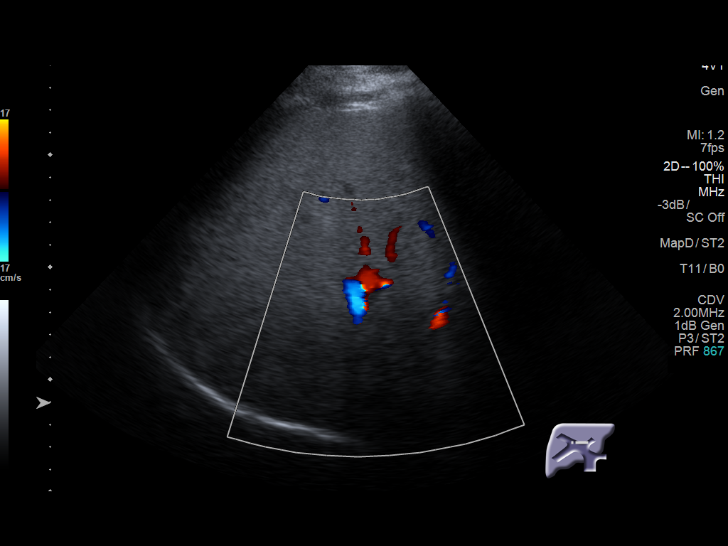
[im 41/41]
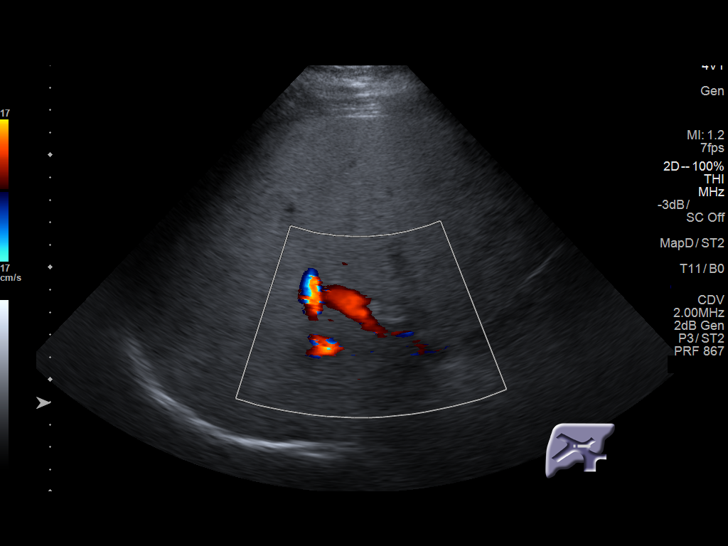

[14 of 25 positions shown; findings below may reference images not displayed]

FINDINGS: Gallbladder:

No gallstones or wall thickening visualized. No sonographic Murphy
sign noted by sonographer.

Common bile duct:

Diameter: 2 mm

Liver:

The liver is echogenic suggesting fatty infiltration. Hepatomegaly
cannot be excluded. No focal hepatic abnormality identified. Portal
vein is patent.
IMPRESSION: 1. Liver is echogenic consistent fatty infiltration. Hepatomegaly
cannot be excluded. No focal hepatic abnormality.

2. No gallstones or biliary distention .

## 2017-06-30 ENCOUNTER — Other Ambulatory Visit: Payer: Self-pay | Admitting: Family

## 2017-06-30 DIAGNOSIS — F332 Major depressive disorder, recurrent severe without psychotic features: Secondary | ICD-10-CM

## 2017-06-30 DIAGNOSIS — F411 Generalized anxiety disorder: Secondary | ICD-10-CM

## 2017-07-13 ENCOUNTER — Other Ambulatory Visit: Payer: Self-pay | Admitting: Family

## 2017-07-13 DIAGNOSIS — F411 Generalized anxiety disorder: Secondary | ICD-10-CM

## 2017-07-23 ENCOUNTER — Telehealth: Payer: Self-pay | Admitting: Family

## 2017-07-23 NOTE — Telephone Encounter (Signed)
What is the name of the medication? ALPRAZolam (XANAX) 0.5 MG tablet  Have you contacted your pharmacy to request a refill? Yes and was told to call pcp  Which [facility] [place]. Pt states that he does not have the money to schedule an appointment. Please call back   Patient notified that their request is being sent to the clinical staff for review and that they should receive a call once it is complete. If they do not receive a call within 24 hours they can check with their pharmacy or our office.

## 2017-07-28 ENCOUNTER — Other Ambulatory Visit: Payer: Self-pay | Admitting: Family

## 2017-07-28 DIAGNOSIS — F411 Generalized anxiety disorder: Secondary | ICD-10-CM

## 2017-07-28 MED ORDER — ALPRAZOLAM 0.5 MG PO TABS: ORAL_TABLET | ORAL | 0 refills | Status: DC

## 2017-07-28 NOTE — Telephone Encounter (Signed)
Please call in one month of rx. We can not extend this again after this month.

## 2017-07-28 NOTE — Telephone Encounter (Signed)
Pt aware and rx sent in 

## 2017-08-13 ENCOUNTER — Other Ambulatory Visit: Payer: Self-pay | Admitting: Family

## 2017-08-13 DIAGNOSIS — F411 Generalized anxiety disorder: Secondary | ICD-10-CM

## 2017-08-13 DIAGNOSIS — F332 Major depressive disorder, recurrent severe without psychotic features: Secondary | ICD-10-CM

## 2017-09-01 ENCOUNTER — Other Ambulatory Visit: Payer: Self-pay | Admitting: Family

## 2017-09-01 DIAGNOSIS — F329 Major depressive disorder, single episode, unspecified: Secondary | ICD-10-CM

## 2017-09-01 DIAGNOSIS — F411 Generalized anxiety disorder: Secondary | ICD-10-CM

## 2017-09-01 DIAGNOSIS — F32A Depression, unspecified: Secondary | ICD-10-CM

## 2017-09-02 NOTE — Telephone Encounter (Signed)
Last seen 12/25/16   [name] 

## 2017-10-01 ENCOUNTER — Encounter: Payer: Self-pay | Admitting: Nurse Practitioner

## 2017-10-01 ENCOUNTER — Ambulatory Visit (INDEPENDENT_AMBULATORY_CARE_PROVIDER_SITE_OTHER): Payer: Self-pay | Admitting: Nurse Practitioner

## 2017-10-01 VITALS — BP 155/98 | HR 82 | Temp 96.8°F | Ht 72.0 in | Wt 282.0 lb

## 2017-10-01 DIAGNOSIS — L409 Psoriasis, unspecified: Secondary | ICD-10-CM

## 2017-10-01 DIAGNOSIS — F332 Major depressive disorder, recurrent severe without psychotic features: Secondary | ICD-10-CM

## 2017-10-01 DIAGNOSIS — E669 Obesity, unspecified: Secondary | ICD-10-CM

## 2017-10-01 DIAGNOSIS — I10 Essential (primary) hypertension: Secondary | ICD-10-CM

## 2017-10-01 DIAGNOSIS — F411 Generalized anxiety disorder: Secondary | ICD-10-CM

## 2017-10-01 MED ORDER — ALPRAZOLAM 0.5 MG PO TABS: ORAL_TABLET | ORAL | 2 refills | Status: DC

## 2017-10-01 MED ORDER — PAROXETINE HCL 40 MG PO TABS: 40.0000 mg | ORAL_TABLET | Freq: Every day | ORAL | 1 refills | Status: DC

## 2017-10-01 NOTE — Patient Instructions (Signed)
Stress and Stress Management Stress is a normal reaction to life events. It is what you feel when life demands more than you are used to or more than you can handle. Some stress can be useful. For example, the stress reaction can help you catch the last bus of the day, study for a test, or meet a deadline at work. But stress that occurs too often or for too long can cause problems. It can affect your emotional health and interfere with relationships and normal daily activities. Too much stress can weaken your immune system and increase your risk for physical illness. If you already have a medical problem, stress can make it worse. What are the causes? All sorts of life events may cause stress. An event that causes stress for one person may not be stressful for another person. Major life events commonly cause stress. These may be positive or negative. Examples include losing your job, moving into a new home, getting married, having a baby, or losing a loved one. Less obvious life events may also cause stress, especially if they occur day after day or in combination. Examples include working long hours, driving in traffic, caring for children, being in debt, or being in a difficult relationship. What are the signs or symptoms? Stress may cause emotional symptoms including, the following:  Anxiety. This is feeling worried, afraid, on edge, overwhelmed, or out of control.  Anger. This is feeling irritated or impatient.  Depression. This is feeling sad, down, helpless, or guilty.  Difficulty focusing, remembering, or making decisions.  Stress may cause physical symptoms, including the following:  Aches and pains. These may affect your head, neck, back, stomach, or other areas of your body.  Tight muscles or clenched jaw.  Low energy or trouble sleeping.  Stress may cause unhealthy behaviors, including the following:  Eating to feel better (overeating) or skipping meals.  Sleeping too little,  too much, or both.  Working too much or putting off tasks (procrastination).  Smoking, drinking alcohol, or using drugs to feel better.  How is this diagnosed? Stress is diagnosed through an assessment by your health care provider. Your health care provider will ask questions about your symptoms and any stressful life events.Your health care provider will also ask about your medical history and may order blood tests or other tests. Certain medical conditions and medicine can cause physical symptoms similar to stress. Mental illness can cause emotional symptoms and unhealthy behaviors similar to stress. Your health care provider may refer you to a mental health professional for further evaluation. How is this treated? Stress management is the recommended treatment for stress.The goals of stress management are reducing stressful life events and coping with stress in healthy ways. Techniques for reducing stressful life events include the following:  Stress identification. Self-monitor for stress and identify what causes stress for you. These skills may help you to avoid some stressful events.  Time management. Set your priorities, keep a calendar of events, and learn to say "no." These tools can help you avoid making too many commitments.  Techniques for coping with stress include the following:  Rethinking the problem. Try to think realistically about stressful events rather than ignoring them or overreacting. Try to find the positives in a stressful situation rather than focusing on the negatives.  Exercise. Physical exercise can release both physical and emotional tension. The key is to find a form of exercise you enjoy and do it regularly.  Relaxation techniques. These relax the body and  mind. Examples include yoga, meditation, tai chi, biofeedback, deep breathing, progressive muscle relaxation, listening to music, being out in nature, journaling, and other hobbies. Again, the key is to find  one or more that you enjoy and can do regularly.  Healthy lifestyle. Eat a balanced diet, get plenty of sleep, and do not smoke. Avoid using alcohol or drugs to relax.  Strong support network. Spend time with family, friends, or other people you enjoy being around.Express your feelings and talk things over with someone you trust.  Counseling or talktherapy with a mental health professional may be helpful if you are having difficulty managing stress on your own. Medicine is typically not recommended for the treatment of stress.Talk to your health care provider if you think you need medicine for symptoms of stress. Follow these instructions at home:  Keep all follow-up visits as directed by your health care provider.  Take all medicines as directed by your health care provider. Contact a health care provider if:  Your symptoms get worse or you start having new symptoms.  You feel overwhelmed by your problems and can no longer manage them on your own. Get help right away if:  You feel like hurting yourself or someone else. This information is not intended to replace advice given to you by your health care provider. Make sure you discuss any questions you have with your health care provider. Document Released: 04/08/2001 Document Revised: 03/20/2016 Document Reviewed: 06/07/2013 Elsevier Interactive Patient Education  2017 [employer] Inc.  

## 2017-10-01 NOTE — Progress Notes (Signed)
Subjective:    Patient ID: Jeffery Peters, male    DOB: 02-22-1986, 31 y.o.   MRN: [id number]  HPI  Jeffery Peters is here today for follow up of chronic medical problem.  Outpatient Encounter Medications as of 10/01/2017  Medication Sig  . ALPRAZolam (XANAX) 0.5 MG tablet TAKE 1 TABLET BY MOUTH TWICE A DAY AS NEEDED FOR ANXIETY  . PARoxetine (PAXIL) 40 MG tablet TAKE 1 TABLET BY MOUTH EVERY DAY  . betamethasone dipropionate (DIPROLENE) 0.05 % cream APPLY TO AFFECTED AREA UPTO TWICE DAILY AS NEEDED( NOT TO FACE,GROIN, UNDER ARMS)  . calcipotriene (DOVONOX) 0.005 % cream Apply topically 2 (two) times daily.  Marland Kitchen Coal Tar Extract 10 % SHAM Apply 1 application topically at bedtime.  Marland Kitchen desonide (DESOWEN) 0.05 % cream   . lisinopril (PRINIVIL,ZESTRIL) 20 MG tablet Take 1 tablet (20 mg total) by mouth daily.  Marland Kitchen PARoxetine (PAXIL) 40 MG tablet TAKE 1 TABLET (40 MG TOTAL) BY MOUTH DAILY.    1. Essential hypertension  No c/o chest pain, sob or headache. Does not check blood pressure at home. He was rx lisinopril in the past but never started taking it. He thought blood pressure was just elevated because he was here and ws nervous. BP Readings from Last 3 Encounters:  10/01/17 (!) 155/98  12/25/16 (!) 144/95  10/29/16 139/87     2. Psoriasis  He sees dermatologist and is on taltz monthly  3. GAD (generalized anxiety disorder)  Patient takes 2 xanax before work then 1 at work. He cannot work on less then this. Gets anxious when around a lot of people.  4. Severe episode of recurrent major depressive disorder, without psychotic features ([place])  he is on paxil and if he does not take he cannot function or go out of house  5. Obesity (BMI 30-39.9)  Weight is up 11 lbs    New complaints: none  Social history: Lives with his mom    Review of Systems  Constitutional: Negative.   HENT: Negative.   Respiratory: Negative.   Cardiovascular: Negative.   Skin:       Psoriasis      Neurological: Negative.   Psychiatric/Behavioral: Negative.        Objective:   Physical Exam  Constitutional: He appears well-developed and well-nourished. No distress.  Cardiovascular: Normal rate and regular rhythm.  Pulmonary/Chest: Effort normal and breath sounds normal.  Neurological: He is alert.  Skin: Skin is warm.  Psychiatric: He has a normal mood and affect. His behavior is normal. Judgment and thought content normal.   BP (!) 155/98   Pulse 82   Temp (!) 96.8 F (36 C) (Oral)   Ht 6' (1.829 m)   Wt 282 lb (127.9 kg)   BMI 38.25 kg/m       Assessment & Plan:  1. Essential hypertension Low sodium diet Patient will try the lisinopril he has at home and will check blood presure and let me know what it is running. - CMP14+EGFR - Lipid panel  2. Psoriasis Keep follow up with cardiology  3. GAD (generalized anxiety disorder) Stress management - ALPRAZolam (XANAX) 0.5 MG tablet; TAKE 1 TABLET BY MOUTH TWICE A DAY AS NEEDED FOR ANXIETY  Dispense: 60 tablet; Refill: 2 - PARoxetine (PAXIL) 40 MG tablet; Take 1 tablet (40 mg total) by mouth daily.  Dispense: 90 tablet; Refill: 1  4. Severe episode of recurrent major depressive disorder, without psychotic features ([place]) - PARoxetine (PAXIL) 40  MG tablet; Take 1 tablet (40 mg total) by mouth daily.  Dispense: 90 tablet; Refill: 1  5. Obesity (BMI 30-39.9) Discussed diet and exercise for person with BMI >25 Will recheck weight in 3-6 months  * refuses blood work due to no insurance  Labs pending Health maintenance reviewed Diet and exercise encouraged Continue all meds Follow up  In 3 months   [place], FNP

## 2017-10-28 ENCOUNTER — Encounter: Payer: Self-pay | Admitting: Nurse Practitioner

## 2017-10-28 ENCOUNTER — Ambulatory Visit (INDEPENDENT_AMBULATORY_CARE_PROVIDER_SITE_OTHER): Payer: Self-pay | Admitting: Nurse Practitioner

## 2017-10-28 VITALS — BP 118/79 | HR 107 | Temp 97.6°F | Ht 72.0 in | Wt 280.0 lb

## 2017-10-28 DIAGNOSIS — I10 Essential (primary) hypertension: Secondary | ICD-10-CM

## 2017-10-28 DIAGNOSIS — A084 Viral intestinal infection, unspecified: Secondary | ICD-10-CM

## 2017-10-28 MED ORDER — LISINOPRIL 20 MG PO TABS: 20.0000 mg | ORAL_TABLET | Freq: Every day | ORAL | 3 refills | Status: DC

## 2017-10-28 NOTE — Progress Notes (Signed)
   Subjective:    Patient ID: Jeffery Peters, male    DOB: 20-Mar-1986, 32 y.o.   MRN: [id number]  HPI  Patient comes in c/o of nausea and vomiting since Monday. Is able to keep liquids down today. Unable to keep food down.   Review of Systems  Constitutional: Negative for appetite change, chills and fever.  Respiratory: Negative.   Cardiovascular: Negative.   Gastrointestinal: Positive for nausea and vomiting. Abdominal pain: only when need sto throw up.  Neurological: Negative.   Psychiatric/Behavioral: Negative.   All other systems reviewed and are negative.      Objective:   Physical Exam  Constitutional: He appears well-developed and well-nourished. No distress.  Cardiovascular: Normal rate and regular rhythm.  Pulmonary/Chest: Effort normal and breath sounds normal.  Abdominal: Soft. Bowel sounds are normal.  Skin: Skin is warm.  Psychiatric: He has a normal mood and affect. His behavior is normal. Judgment and thought content normal.   BP 118/79   Pulse (!) 107   Temp 97.6 F (36.4 C) (Oral)   Ht 6' (1.829 m)   Wt 280 lb (127 kg)   BMI 37.97 kg/m       Assessment & Plan:  1. Viral gastroenteritis First 24 Hours-Clear liquids  popsicles  Jello  gatorade  Sprite Second 24 hours-Add Full liquids ( Liquids you cant see through) Third 24 hours- Bland diet ( foods that are baked or broiled)  *avoiding fried foods and highly spiced foods* During these 3 days  Avoid milk, cheese, ice cream or any other dairy products  Avoid caffeine- REMEMBER Mt. Dew and Mello Yellow contain lots of caffeine You should eat and drink in  Frequent small volumes If no improvement in symptoms or worsen in 2-3 days should RETRUN TO OFFICE or go to ER!      2. Essential hypertension Just needed refill on meds - lisinopril (PRINIVIL,ZESTRIL) 20 MG tablet; Take 1 tablet (20 mg total) by mouth daily.  Dispense: 90 tablet; Refill: 3  Mary-Margaret Daphine DeutscherMartin, FNP

## 2017-10-28 NOTE — Patient Instructions (Signed)

## 2018-01-01 ENCOUNTER — Ambulatory Visit (INDEPENDENT_AMBULATORY_CARE_PROVIDER_SITE_OTHER): Payer: Self-pay | Admitting: *Deleted

## 2018-01-01 DIAGNOSIS — Z23 Encounter for immunization: Secondary | ICD-10-CM

## 2018-01-01 NOTE — Progress Notes (Signed)
Need for PPD due to high risk meds PPD placed L forearm

## 2018-01-03 ENCOUNTER — Other Ambulatory Visit: Payer: Self-pay | Admitting: Nurse Practitioner

## 2018-01-03 DIAGNOSIS — F411 Generalized anxiety disorder: Secondary | ICD-10-CM

## 2018-01-04 LAB — TB SKIN TEST
INDURATION: 0 mm
TB SKIN TEST: NEGATIVE

## 2018-04-23 ENCOUNTER — Other Ambulatory Visit: Payer: Self-pay | Admitting: Family

## 2018-04-23 DIAGNOSIS — F411 Generalized anxiety disorder: Secondary | ICD-10-CM

## 2018-04-27 NOTE — Telephone Encounter (Signed)
Last refill without being seen 

## 2018-05-27 ENCOUNTER — Other Ambulatory Visit: Payer: Self-pay | Admitting: Nurse Practitioner

## 2018-05-27 DIAGNOSIS — F411 Generalized anxiety disorder: Secondary | ICD-10-CM

## 2018-06-01 ENCOUNTER — Other Ambulatory Visit: Payer: Self-pay | Admitting: Nurse Practitioner

## 2018-06-01 DIAGNOSIS — F411 Generalized anxiety disorder: Secondary | ICD-10-CM

## 2018-06-03 ENCOUNTER — Other Ambulatory Visit: Payer: Self-pay | Admitting: Family

## 2018-06-03 NOTE — Telephone Encounter (Signed)
What is the name of the medication? xanax  Have you contacted your pharmacy to request a refill? Yes multiple times   [facility]    Patient notified that their request is being sent to the clinical staff for review and that they should receive a call once it is complete. If they do not receive a call within 24 hours they can check with their pharmacy or our office.

## 2018-06-04 ENCOUNTER — Ambulatory Visit (INDEPENDENT_AMBULATORY_CARE_PROVIDER_SITE_OTHER): Payer: Self-pay | Admitting: Family

## 2018-06-04 ENCOUNTER — Encounter: Payer: Self-pay | Admitting: Family

## 2018-06-04 VITALS — BP 126/83 | HR 85 | Temp 97.2°F | Ht 72.0 in | Wt 278.4 lb

## 2018-06-04 DIAGNOSIS — I10 Essential (primary) hypertension: Secondary | ICD-10-CM

## 2018-06-04 DIAGNOSIS — F411 Generalized anxiety disorder: Secondary | ICD-10-CM

## 2018-06-04 DIAGNOSIS — F332 Major depressive disorder, recurrent severe without psychotic features: Secondary | ICD-10-CM

## 2018-06-04 DIAGNOSIS — L409 Psoriasis, unspecified: Secondary | ICD-10-CM

## 2018-06-04 MED ORDER — PAROXETINE HCL 40 MG PO TABS: 40.0000 mg | ORAL_TABLET | Freq: Every day | ORAL | 0 refills | Status: DC

## 2018-06-04 MED ORDER — ALPRAZOLAM 0.5 MG PO TABS: 0.5000 mg | ORAL_TABLET | Freq: Two times a day (BID) | ORAL | 5 refills | Status: DC | PRN

## 2018-06-04 MED ORDER — LISINOPRIL 20 MG PO TABS: 20.0000 mg | ORAL_TABLET | Freq: Every day | ORAL | 3 refills | Status: DC

## 2018-06-04 NOTE — Patient Instructions (Signed)

## 2018-06-04 NOTE — Progress Notes (Signed)
Subjective:    Patient ID: Jeffery Peters, male    DOB: [birth year], 32 y.o.   MRN: [id number]  Chief Complaint  Patient presents with  . Medical Management of Chronic Issues    medication refill   PT presents to the office today for chronic follow up. PT is followed by the Dermatologists annually for psoriasis. He is currently getting taltz injection monthly. States this is doing well.  Hypertension  This is a chronic problem. The current episode started more than 1 year ago. The problem has been resolved since onset. The problem is controlled. Associated symptoms include anxiety and peripheral edema. Pertinent negatives include no headaches, malaise/fatigue or shortness of breath. Risk factors for coronary artery disease include dyslipidemia, obesity, male gender, smoking/tobacco exposure and sedentary lifestyle. The current treatment provides moderate improvement. There is no history of kidney disease, CAD/MI, CVA or heart failure.  Anxiety  Presents for follow-up visit. Symptoms include decreased concentration, excessive worry, irritability, nervous/anxious behavior and restlessness. Patient reports no shortness of breath. Symptoms occur most days. The severity of symptoms is moderate. The quality of sleep is good.    Depression         This is a chronic problem.  The current episode started more than 1 year ago.   The onset quality is gradual.   The problem occurs intermittently.  The problem has been waxing and waning since onset.  Associated symptoms include decreased concentration, irritable, restlessness and sad.  Associated symptoms include no helplessness, no hopelessness and no headaches.  Past treatments include SSRIs - Selective serotonin reuptake inhibitors.  Compliance with treatment is good.  Past medical history includes anxiety.       Review of Systems  Constitutional: Positive for irritability. Negative for malaise/fatigue.  Respiratory: Negative for shortness of breath.     Neurological: Negative for headaches.  Psychiatric/Behavioral: Positive for decreased concentration and depression. The patient is nervous/anxious.   All other systems reviewed and are negative.      Objective:   Physical Exam  Constitutional: He is oriented to person, place, and time. He appears well-developed and well-nourished. He is irritable. No distress.  HENT:  Head: Normocephalic.  Right Ear: External ear normal.  Left Ear: External ear normal.  Mouth/Throat: Oropharynx is clear and moist.  Eyes: Pupils are equal, round, and reactive to light. Right eye exhibits no discharge. Left eye exhibits no discharge.  Neck: Normal range of motion. Neck supple. No thyromegaly present.  Cardiovascular: Normal rate, regular rhythm, normal heart sounds and intact distal pulses.  No murmur heard. Pulmonary/Chest: Effort normal and breath sounds normal. No respiratory distress. He has no wheezes.  Abdominal: Soft. Bowel sounds are normal. He exhibits no distension. There is no tenderness.  Musculoskeletal: Normal range of motion. He exhibits no edema or tenderness.  Neurological: He is alert and oriented to person, place, and time. He has normal reflexes. No cranial nerve deficit.  Skin: Skin is warm and dry. Rash noted. There is erythema.  Psychiatric: He has a normal mood and affect. His behavior is normal. Judgment and thought content normal.  Vitals reviewed.     BP 126/83   Pulse 85   Temp (!) 97.2 F (36.2 C) (Oral)   Ht 6' (1.829 m)   Wt 278 lb 6.4 oz (126.3 kg)   BMI 37.76 kg/m      Assessment & Plan:  Jeffery Peters comes in today with chief complaint of Medical Management of Chronic Issues (medication refill)  Diagnosis and orders addressed:  1. Essential hypertension - CMP14+EGFR - lisinopril (PRINIVIL,ZESTRIL) 20 MG tablet; Take 1 tablet (20 mg total) by mouth daily.  Dispense: 90 tablet; Refill: 3  2. Psoriasis - CMP14+EGFR  3. GAD (generalized anxiety  disorder) - CMP14+EGFR - PARoxetine (PAXIL) 40 MG tablet; Take 1 tablet (40 mg total) by mouth daily.  Dispense: 90 tablet; Refill: 0 - ALPRAZolam (XANAX) 0.5 MG tablet; Take 1 tablet (0.5 mg total) by mouth 2 (two) times daily as needed for anxiety.  Dispense: 60 tablet; Refill: 5  4. Severe episode of recurrent major depressive disorder, without psychotic features ([place]) - CMP14+EGFR - PARoxetine (PAXIL) 40 MG tablet; Take 1 tablet (40 mg total) by mouth daily.  Dispense: 90 tablet; Refill: 0  5. Morbid obesity ([place]) - CMP14+EGFR     Labs pending Health Maintenance reviewed Diet and exercise encouraged  Follow up plan: 6 months and keep dermatologists appt   Evelina Dun, FNP

## 2018-06-04 NOTE — Telephone Encounter (Signed)
Closing encounter Pt talked to billing & appt made for 06/04/18

## 2018-06-05 LAB — CMP14+EGFR
A/G RATIO: 1.7 (ref 1.2–2.2)
ALBUMIN: 4.2 g/dL (ref 3.5–5.5)
ALT: 43 IU/L (ref 0–44)
AST: 64 IU/L — ABNORMAL HIGH (ref 0–40)
Alkaline Phosphatase: 77 IU/L (ref 39–117)
BUN / CREAT RATIO: 11 (ref 9–20)
BUN: 9 mg/dL (ref 6–20)
Bilirubin Total: 0.4 mg/dL (ref 0.0–1.2)
CALCIUM: 9.8 mg/dL (ref 8.7–10.2)
CO2: 21 mmol/L (ref 20–29)
Chloride: 99 mmol/L (ref 96–106)
Creatinine, Ser: 0.79 mg/dL (ref 0.76–1.27)
GFR, EST AFRICAN AMERICAN: 136 mL/min/{1.73_m2} (ref >59)
GFR, EST NON AFRICAN AMERICAN: 118 mL/min/{1.73_m2} (ref >59)
Globulin, Total: 2.5 g/dL (ref 1.5–4.5)
Glucose: 112 mg/dL — ABNORMAL HIGH (ref 65–99)
POTASSIUM: 4.5 mmol/L (ref 3.5–5.2)
Sodium: 140 mmol/L (ref 134–144)
TOTAL PROTEIN: 6.7 g/dL (ref 6.0–8.5)

## 2018-09-28 ENCOUNTER — Telehealth: Payer: Self-pay | Admitting: Family

## 2018-09-28 DIAGNOSIS — F411 Generalized anxiety disorder: Secondary | ICD-10-CM

## 2018-09-28 MED ORDER — ALPRAZOLAM 0.5 MG PO TABS
0.5000 mg | ORAL_TABLET | Freq: Two times a day (BID) | ORAL | 1 refills | Status: DC | PRN
Start: 2018-09-28 — End: 2018-12-24

## 2018-09-28 NOTE — Telephone Encounter (Signed)
Prescription sent to pharmacy.

## 2018-10-28 ENCOUNTER — Other Ambulatory Visit: Payer: Self-pay | Admitting: Family

## 2018-10-28 DIAGNOSIS — F332 Major depressive disorder, recurrent severe without psychotic features: Secondary | ICD-10-CM

## 2018-10-28 DIAGNOSIS — F411 Generalized anxiety disorder: Secondary | ICD-10-CM

## 2018-11-23 ENCOUNTER — Other Ambulatory Visit: Payer: Self-pay | Admitting: Family

## 2018-11-23 DIAGNOSIS — F411 Generalized anxiety disorder: Secondary | ICD-10-CM

## 2018-11-23 DIAGNOSIS — F332 Major depressive disorder, recurrent severe without psychotic features: Secondary | ICD-10-CM

## 2018-11-25 ENCOUNTER — Other Ambulatory Visit: Payer: Self-pay | Admitting: Family

## 2018-11-25 DIAGNOSIS — F411 Generalized anxiety disorder: Secondary | ICD-10-CM

## 2018-11-25 DIAGNOSIS — F332 Major depressive disorder, recurrent severe without psychotic features: Secondary | ICD-10-CM

## 2018-12-07 ENCOUNTER — Other Ambulatory Visit: Payer: Self-pay | Admitting: Family

## 2018-12-07 DIAGNOSIS — F411 Generalized anxiety disorder: Secondary | ICD-10-CM

## 2018-12-16 ENCOUNTER — Other Ambulatory Visit: Payer: Self-pay | Admitting: Family

## 2018-12-16 DIAGNOSIS — F411 Generalized anxiety disorder: Secondary | ICD-10-CM

## 2018-12-17 NOTE — Telephone Encounter (Signed)
Last seen 06/04/18

## 2018-12-22 ENCOUNTER — Other Ambulatory Visit: Payer: Self-pay | Admitting: Family

## 2018-12-22 DIAGNOSIS — F411 Generalized anxiety disorder: Secondary | ICD-10-CM

## 2018-12-23 NOTE — Telephone Encounter (Signed)
Last seen 06/04/18

## 2018-12-24 ENCOUNTER — Encounter: Payer: Self-pay | Admitting: Family

## 2018-12-24 ENCOUNTER — Ambulatory Visit (INDEPENDENT_AMBULATORY_CARE_PROVIDER_SITE_OTHER): Payer: Self-pay | Admitting: Family

## 2018-12-24 DIAGNOSIS — I10 Essential (primary) hypertension: Secondary | ICD-10-CM

## 2018-12-24 DIAGNOSIS — Z79899 Other long term (current) drug therapy: Secondary | ICD-10-CM | POA: Insufficient documentation

## 2018-12-24 DIAGNOSIS — F332 Major depressive disorder, recurrent severe without psychotic features: Secondary | ICD-10-CM

## 2018-12-24 DIAGNOSIS — F1320 Sedative, hypnotic or anxiolytic dependence, uncomplicated: Secondary | ICD-10-CM

## 2018-12-24 DIAGNOSIS — F411 Generalized anxiety disorder: Secondary | ICD-10-CM

## 2018-12-24 MED ORDER — ALPRAZOLAM 0.5 MG PO TABS: 0.5000 mg | ORAL_TABLET | Freq: Two times a day (BID) | ORAL | 5 refills | Status: DC | PRN

## 2018-12-24 NOTE — Patient Instructions (Signed)

## 2018-12-24 NOTE — Progress Notes (Signed)
Subjective:    Patient ID: Jeffery Peters, male    DOB: Jan 17, 1986, 33 y.o.   MRN: [id number]  Chief Complaint  Patient presents with  . Medical Management of Chronic Issues   Pt presents to the office today for chronic follow up.  Anxiety  Presents for follow-up visit. Symptoms include depressed mood, excessive worry, irritability, nervous/anxious behavior and restlessness. Patient reports no shortness of breath. Symptoms occur most days. The severity of symptoms is moderate.    Depression         This is a chronic problem.  The current episode started more than 1 year ago.   The onset quality is sudden.   The problem occurs intermittently.  The problem has been waxing and waning since onset.  Associated symptoms include irritable, restlessness and sad.  Associated symptoms include no helplessness and no hopelessness.  Past treatments include SSRIs - Selective serotonin reuptake inhibitors.  Past medical history includes anxiety.   Hypertension  This is a chronic problem. The current episode started more than 1 year ago. The problem has been waxing and waning since onset. The problem is uncontrolled. Associated symptoms include anxiety. Pertinent negatives include no peripheral edema, PND or shortness of breath. Risk factors for coronary artery disease include dyslipidemia and obesity.      Review of Systems  Constitutional: Positive for irritability.  Respiratory: Negative for shortness of breath.   Cardiovascular: Negative for PND.  Psychiatric/Behavioral: Positive for depression. The patient is nervous/anxious.   All other systems reviewed and are negative.      Objective:   Physical Exam Vitals signs reviewed.  Constitutional:      General: He is irritable. He is not in acute distress.    Appearance: He is well-developed.  HENT:     Head: Normocephalic.     Right Ear: Tympanic membrane normal.     Left Ear: Tympanic membrane normal.  Eyes:     General:        Right  eye: No discharge.        Left eye: No discharge.     Pupils: Pupils are equal, round, and reactive to light.  Neck:     Musculoskeletal: Normal range of motion and neck supple.     Thyroid: No thyromegaly.  Cardiovascular:     Rate and Rhythm: Normal rate and regular rhythm.     Heart sounds: Normal heart sounds. No murmur.  Pulmonary:     Effort: Pulmonary effort is normal. No respiratory distress.     Breath sounds: Normal breath sounds. No wheezing.  Abdominal:     General: Bowel sounds are normal. There is no distension.     Palpations: Abdomen is soft.     Tenderness: There is no abdominal tenderness.  Musculoskeletal: Normal range of motion.        General: No tenderness.  Neurological:     Mental Status: He is alert and oriented to person, place, and time.     Cranial Nerves: No cranial nerve deficit.     Deep Tendon Reflexes: Reflexes are normal and symmetric.  Psychiatric:        Behavior: Behavior normal.        Thought Content: Thought content normal.        Judgment: Judgment normal.       BP (!) 152/93   Pulse 95   Temp (!) 97.5 F (36.4 C) (Oral)   Ht 5\' 11"  (1.803 m)   Wt 285 lb 3.2  oz (129.4 kg)   BMI 39.78 kg/m      Assessment & Plan:  Jeffery Peters comes in today with chief complaint of Medical Management of Chronic Issues   Diagnosis and orders addressed:  1. GAD (generalized anxiety disorder) - ALPRAZolam (XANAX) 0.5 MG tablet; Take 1 tablet (0.5 mg total) by mouth 2 (two) times daily as needed for anxiety.  Dispense: 60 tablet; Refill: 5 - ToxASSURE Select 13 (MW), Urine  2. Morbid obesity (HCC)  3. Severe episode of recurrent major depressive disorder, without psychotic features (HCC)  4. Essential hypertension  5. Controlled substance agreement signed  6. Benzodiazepine dependence (HCC)   Pt reviewed in [place] controlled database- No red flags noted Health Maintenance reviewed Diet and exercise encouraged  Follow up plan: 6 months    Jannifer Rodney, FNP

## 2018-12-29 ENCOUNTER — Other Ambulatory Visit: Payer: Self-pay | Admitting: Family

## 2018-12-29 DIAGNOSIS — F411 Generalized anxiety disorder: Secondary | ICD-10-CM

## 2018-12-29 DIAGNOSIS — F332 Major depressive disorder, recurrent severe without psychotic features: Secondary | ICD-10-CM

## 2018-12-29 LAB — TOXASSURE SELECT 13 (MW), URINE

## 2019-04-18 ENCOUNTER — Other Ambulatory Visit: Payer: Self-pay

## 2019-04-19 ENCOUNTER — Ambulatory Visit: Payer: Self-pay | Admitting: Family Medicine

## 2019-04-19 ENCOUNTER — Ambulatory Visit (INDEPENDENT_AMBULATORY_CARE_PROVIDER_SITE_OTHER): Payer: Self-pay | Admitting: Nurse Practitioner

## 2019-04-19 ENCOUNTER — Encounter: Payer: Self-pay | Admitting: Nurse Practitioner

## 2019-04-19 ENCOUNTER — Other Ambulatory Visit: Payer: Self-pay

## 2019-04-19 VITALS — BP 145/94 | HR 101 | Temp 97.0°F | Ht 71.0 in | Wt 275.0 lb

## 2019-04-19 DIAGNOSIS — M25562 Pain in left knee: Secondary | ICD-10-CM

## 2019-04-19 MED ORDER — NAPROXEN 500 MG PO TABS: 500.0000 mg | ORAL_TABLET | Freq: Two times a day (BID) | ORAL | 1 refills | Status: DC

## 2019-04-19 NOTE — Progress Notes (Signed)
   Subjective:    Patient ID: [name], male    DOB: 02-03-1986, 33 y.o.   MRN: [id number]   Chief Complaint: Knee Pain (Swelling and red area on it)   HPI Patient come sin c/o left knee pain for 10 days. He denies injury. Has slight pain. Worse when standing on his feet. He has a large bruise on knee but again does not remember any injury. He has not been doing anything for it. Rates pain 2/10.    Review of Systems  Musculoskeletal: Positive for arthralgias (left knee).  All other systems reviewed and are negative.      Objective:   Physical Exam Vitals signs and nursing note reviewed.  Constitutional:      Appearance: Normal appearance.  Musculoskeletal:     Comments: FROM of left knee with pain on full extenion No patella tenderness Mild effusion All ligaments intact  Skin:    General: Skin is warm and dry.     Comments: 3cm by 8cm conturion on inner srfaceof left knee- varying in color from yello to black.    Neurological:     General: No focal deficit present.     Mental Status: He is alert and oriented to person, place, and time.  Psychiatric:        Mood and Affect: Mood normal.        Behavior: Behavior normal.    BP (!) 145/94   Pulse (!) 101   Temp (!) 97 F (36.1 C) (Oral)   Ht 5\' 11"  (1.803 m)   Wt 275 lb (124.7 kg)   BMI 38.35 kg/m         Assessment & Plan:  Arlee A Mullet in today with chief complaint of Knee Pain (Swelling and red area on it)   1. Acute pain of left knee Rest Ice BID Compression sleeve when working elevate when sitting - naproxen (NAPROSYN) 500 MG tablet; Take 1 tablet (500 mg total) by mouth 2 (two) times daily with a meal.  Dispense: 60 tablet; Refill: [place], FNP

## 2019-06-14 ENCOUNTER — Other Ambulatory Visit: Payer: Self-pay | Admitting: Nurse Practitioner

## 2019-06-14 DIAGNOSIS — M25562 Pain in left knee: Secondary | ICD-10-CM

## 2019-06-18 ENCOUNTER — Other Ambulatory Visit: Payer: Self-pay | Admitting: Family

## 2019-06-18 DIAGNOSIS — F411 Generalized anxiety disorder: Secondary | ICD-10-CM

## 2019-06-23 ENCOUNTER — Telehealth: Payer: Self-pay | Admitting: Family

## 2019-06-24 ENCOUNTER — Other Ambulatory Visit: Payer: Self-pay

## 2019-06-24 ENCOUNTER — Ambulatory Visit (INDEPENDENT_AMBULATORY_CARE_PROVIDER_SITE_OTHER): Payer: Self-pay | Admitting: Family

## 2019-06-24 ENCOUNTER — Encounter: Payer: Self-pay | Admitting: Family

## 2019-06-24 VITALS — BP 131/83 | HR 112 | Temp 97.0°F | Ht 71.0 in | Wt 283.0 lb

## 2019-06-24 DIAGNOSIS — F411 Generalized anxiety disorder: Secondary | ICD-10-CM

## 2019-06-24 DIAGNOSIS — Z79899 Other long term (current) drug therapy: Secondary | ICD-10-CM

## 2019-06-24 DIAGNOSIS — F332 Major depressive disorder, recurrent severe without psychotic features: Secondary | ICD-10-CM

## 2019-06-24 DIAGNOSIS — F17200 Nicotine dependence, unspecified, uncomplicated: Secondary | ICD-10-CM | POA: Insufficient documentation

## 2019-06-24 DIAGNOSIS — F1320 Sedative, hypnotic or anxiolytic dependence, uncomplicated: Secondary | ICD-10-CM

## 2019-06-24 MED ORDER — PAROXETINE HCL 40 MG PO TABS: 40.0000 mg | ORAL_TABLET | Freq: Every day | ORAL | 1 refills | Status: DC

## 2019-06-24 MED ORDER — BUSPIRONE HCL 10 MG PO TABS: 10.0000 mg | ORAL_TABLET | Freq: Three times a day (TID) | ORAL | 5 refills | Status: DC

## 2019-06-24 MED ORDER — ALPRAZOLAM 0.5 MG PO TABS: 0.5000 mg | ORAL_TABLET | Freq: Two times a day (BID) | ORAL | 5 refills | Status: DC | PRN

## 2019-06-24 NOTE — Patient Instructions (Signed)

## 2019-06-24 NOTE — Progress Notes (Signed)
Subjective:    Patient ID: Jeffery Peters, male    DOB: [birth year], 33 y.o.   MRN: [id number]  Chief Complaint  Patient presents with  . Medical Management of Chronic Issues  . Medication Refill    xanax and paxil   Pt presents to the office today for GAD follow up. States he is doing well.  Anxiety Presents for follow-up visit. Symptoms include decreased concentration, depressed mood, excessive worry, irritability, nervous/anxious behavior, panic and restlessness. Symptoms occur occasionally. The severity of symptoms is moderate. The quality of sleep is good.    Depression        This is a chronic problem.  The current episode started more than 1 year ago.   The onset quality is gradual.   The problem occurs intermittently.  The problem has been waxing and waning since onset.  Associated symptoms include decreased concentration, irritable, restlessness and sad.  Associated symptoms include no helplessness and no hopelessness.  Past treatments include SSRIs - Selective serotonin reuptake inhibitors.  Past medical history includes anxiety.       Review of Systems  Constitutional: Positive for irritability.  Psychiatric/Behavioral: Positive for decreased concentration and depression. The patient is nervous/anxious.   All other systems reviewed and are negative.      Objective:   Physical Exam Vitals signs reviewed.  Constitutional:      General: He is irritable. He is not in acute distress.    Appearance: He is well-developed.  HENT:     Head: Normocephalic.     Right Ear: Tympanic membrane normal.     Left Ear: Tympanic membrane normal.  Eyes:     General:        Right eye: No discharge.        Left eye: No discharge.     Pupils: Pupils are equal, round, and reactive to light.  Neck:     Musculoskeletal: Normal range of motion and neck supple.     Thyroid: No thyromegaly.  Cardiovascular:     Rate and Rhythm: Normal rate and regular rhythm.     Heart sounds: Normal  heart sounds. No murmur.  Pulmonary:     Effort: Pulmonary effort is normal. No respiratory distress.     Breath sounds: Wheezing present.  Abdominal:     General: Bowel sounds are normal. There is no distension.     Palpations: Abdomen is soft.     Tenderness: There is no abdominal tenderness.  Musculoskeletal: Normal range of motion.        General: No tenderness.  Neurological:     Mental Status: He is alert and oriented to person, place, and time.     Cranial Nerves: No cranial nerve deficit.     Deep Tendon Reflexes: Reflexes are normal and symmetric.  Psychiatric:        Behavior: Behavior normal.        Thought Content: Thought content normal.        Judgment: Judgment normal.       BP 131/83   Pulse (!) 112   Temp (!) 97 F (36.1 C) (Temporal)   Ht 5\' 11"  (1.803 m)   Wt 283 lb (128.4 kg)   BMI 39.47 kg/m      Assessment & Plan:  Jeffery Peters comes in today with chief complaint of Medical Management of Chronic Issues and Medication Refill (xanax and paxil)   Diagnosis and orders addressed:  1. GAD (generalized anxiety disorder) - PARoxetine (PAXIL)  40 MG tablet; Take 1 tablet (40 mg total) by mouth daily. (Needs to be seen before next refill)  Dispense: 90 tablet; Refill: 1 - ALPRAZolam (XANAX) 0.5 MG tablet; Take 1 tablet (0.5 mg total) by mouth 2 (two) times daily as needed for anxiety.  Dispense: 60 tablet; Refill: 5 - busPIRone (BUSPAR) 10 MG tablet; Take 1 tablet (10 mg total) by mouth 3 (three) times daily.  Dispense: 90 tablet; Refill: 5  2. Severe episode of recurrent major depressive disorder, without psychotic features (HCC) - PARoxetine (PAXIL) 40 MG tablet; Take 1 tablet (40 mg total) by mouth daily. (Needs to be seen before next refill)  Dispense: 90 tablet; Refill: 1 - busPIRone (BUSPAR) 10 MG tablet; Take 1 tablet (10 mg total) by mouth 3 (three) times daily.  Dispense: 90 tablet; Refill: 5  3. Controlled substance agreement signed  4.  Benzodiazepine dependence (HCC)  5. Morbid obesity (HCC)  6. Current smoker Smoking cessation discussed  We will add buspar today. We discussed trying to wean off xanax and adding Buspar BID or TID. He is hesitant, but he will try.  Stress management discussed Pt reviewed in [place] controlled database- No red flags    Jannifer Rodneyhristy Audreyana Huntsberry, FNP

## 2019-07-17 ENCOUNTER — Other Ambulatory Visit: Payer: Self-pay | Admitting: Family

## 2019-07-17 DIAGNOSIS — F411 Generalized anxiety disorder: Secondary | ICD-10-CM

## 2019-07-17 DIAGNOSIS — F332 Major depressive disorder, recurrent severe without psychotic features: Secondary | ICD-10-CM

## 2019-07-20 ENCOUNTER — Ambulatory Visit (INDEPENDENT_AMBULATORY_CARE_PROVIDER_SITE_OTHER): Payer: Self-pay

## 2019-07-20 ENCOUNTER — Other Ambulatory Visit: Payer: Self-pay

## 2019-07-20 DIAGNOSIS — Z111 Encounter for screening for respiratory tuberculosis: Secondary | ICD-10-CM

## 2019-07-22 LAB — TB SKIN TEST
Induration: 0 mm
TB Skin Test: NEGATIVE

## 2019-11-13 ENCOUNTER — Other Ambulatory Visit: Payer: Self-pay | Admitting: Family

## 2019-11-13 DIAGNOSIS — F332 Major depressive disorder, recurrent severe without psychotic features: Secondary | ICD-10-CM

## 2019-11-13 DIAGNOSIS — F411 Generalized anxiety disorder: Secondary | ICD-10-CM

## 2019-12-02 ENCOUNTER — Telehealth: Payer: Self-pay | Admitting: Family

## 2019-12-02 DIAGNOSIS — F332 Major depressive disorder, recurrent severe without psychotic features: Secondary | ICD-10-CM

## 2019-12-02 DIAGNOSIS — F411 Generalized anxiety disorder: Secondary | ICD-10-CM

## 2019-12-02 MED ORDER — PAROXETINE HCL 40 MG PO TABS: 40.0000 mg | ORAL_TABLET | Freq: Every day | ORAL | 0 refills | Status: DC

## 2019-12-02 NOTE — Telephone Encounter (Signed)
What is the name of the medication? Paxil  Have you contacted your pharmacy to request a refill? Yes  Which pharmacy would you like this sent to? [facility]  Patient notified that their request is being sent to the clinical staff for review and that they should receive a call once it is complete. If they do not receive a call within 24 hours they can check with their pharmacy or our office.   Jeffery Peters' pt  Please call pt  He has an appt scheduled.

## 2019-12-07 ENCOUNTER — Other Ambulatory Visit: Payer: Self-pay

## 2019-12-08 ENCOUNTER — Ambulatory Visit (INDEPENDENT_AMBULATORY_CARE_PROVIDER_SITE_OTHER): Payer: Self-pay | Admitting: Family

## 2019-12-08 ENCOUNTER — Encounter: Payer: Self-pay | Admitting: Family

## 2019-12-08 VITALS — BP 135/80 | HR 82 | Temp 98.9°F | Ht 71.0 in | Wt 278.8 lb

## 2019-12-08 DIAGNOSIS — F332 Major depressive disorder, recurrent severe without psychotic features: Secondary | ICD-10-CM

## 2019-12-08 DIAGNOSIS — Z79899 Other long term (current) drug therapy: Secondary | ICD-10-CM

## 2019-12-08 DIAGNOSIS — I10 Essential (primary) hypertension: Secondary | ICD-10-CM

## 2019-12-08 DIAGNOSIS — F411 Generalized anxiety disorder: Secondary | ICD-10-CM

## 2019-12-08 DIAGNOSIS — F1320 Sedative, hypnotic or anxiolytic dependence, uncomplicated: Secondary | ICD-10-CM

## 2019-12-08 DIAGNOSIS — L409 Psoriasis, unspecified: Secondary | ICD-10-CM

## 2019-12-08 DIAGNOSIS — F17200 Nicotine dependence, unspecified, uncomplicated: Secondary | ICD-10-CM

## 2019-12-08 MED ORDER — BUSPIRONE HCL 15 MG PO TABS: 15.0000 mg | ORAL_TABLET | Freq: Three times a day (TID) | ORAL | 3 refills | Status: DC

## 2019-12-08 MED ORDER — ALPRAZOLAM 0.5 MG PO TABS: 0.5000 mg | ORAL_TABLET | Freq: Two times a day (BID) | ORAL | 5 refills | Status: DC | PRN

## 2019-12-08 MED ORDER — PAROXETINE HCL 40 MG PO TABS: 40.0000 mg | ORAL_TABLET | Freq: Every day | ORAL | 0 refills | Status: DC

## 2019-12-08 MED ORDER — LISINOPRIL 20 MG PO TABS: 20.0000 mg | ORAL_TABLET | Freq: Every day | ORAL | 3 refills | Status: DC

## 2019-12-08 NOTE — Progress Notes (Signed)
Subjective:    Patient ID: Jeffery Peters, male    DOB: July 10, 1986, 34 y.o.   MRN: [id number]  Chief Complaint  Patient presents with  . Medical Management of Chronic Issues   Pt presents to the office today for chronic follow up. Pt has psoriasis and is followed by Dermatologists annually.  Hypertension This is a chronic problem. The current episode started more than 1 year ago. The problem has been waxing and waning since onset. The problem is uncontrolled. Associated symptoms include anxiety. Pertinent negatives include no headaches, malaise/fatigue, peripheral edema or shortness of breath. Risk factors for coronary artery disease include obesity, male gender, sedentary lifestyle, smoking/tobacco exposure and dyslipidemia. Past treatments include ACE inhibitors (missed yesterdays and todays dose). The current treatment provides moderate improvement. There is no history of kidney disease or heart failure.  Anxiety Presents for follow-up visit. Symptoms include depressed mood, excessive worry, irritability and nervous/anxious behavior. Patient reports no shortness of breath. Symptoms occur occasionally. The quality of sleep is good.    Depression        This is a chronic problem.  The current episode started more than 1 year ago.   The onset quality is gradual.   The problem occurs intermittently.  Associated symptoms include no headaches.  Past treatments include SSRIs - Selective serotonin reuptake inhibitors.  Past medical history includes anxiety.       Review of Systems  Constitutional: Positive for irritability. Negative for malaise/fatigue.  Respiratory: Negative for shortness of breath.   Neurological: Negative for headaches.  Psychiatric/Behavioral: Positive for depression. The patient is nervous/anxious.   All other systems reviewed and are negative.      Objective:   Physical Exam Vitals reviewed.  Constitutional:      General: He is not in acute distress.  Appearance: He is well-developed.  HENT:     Head: Normocephalic.     Right Ear: Tympanic membrane normal.     Left Ear: Tympanic membrane normal.  Eyes:     General:        Right eye: No discharge.        Left eye: No discharge.     Pupils: Pupils are equal, round, and reactive to light.  Neck:     Thyroid: No thyromegaly.  Cardiovascular:     Rate and Rhythm: Normal rate and regular rhythm.     Heart sounds: Normal heart sounds. No murmur.  Pulmonary:     Effort: Pulmonary effort is normal. No respiratory distress.     Breath sounds: Normal breath sounds. No wheezing.  Abdominal:     General: Bowel sounds are normal. There is no distension.     Palpations: Abdomen is soft.     Tenderness: There is no abdominal tenderness.  Musculoskeletal:        General: No tenderness. Normal range of motion.     Cervical back: Normal range of motion and neck supple.  Skin:    General: Skin is warm and dry.     Findings: No erythema or rash.  Neurological:     Mental Status: He is alert and oriented to person, place, and time.     Cranial Nerves: No cranial nerve deficit.     Deep Tendon Reflexes: Reflexes are normal and symmetric.  Psychiatric:        Behavior: Behavior normal.        Thought Content: Thought content normal.        Judgment: Judgment normal.  BP (!) 146/84   Pulse (!) 103   Temp 98.9 F (37.2 C) (Temporal)   Ht 5\' 11"  (1.803 m)   Wt 278 lb 12.8 oz (126.5 kg)   SpO2 96%   BMI 38.88 kg/m      Assessment & Plan:  Jeffery Peters comes in today with chief complaint of Medical Management of Chronic Issues   Diagnosis and orders addressed:  1. Essential hypertension - lisinopril (ZESTRIL) 20 MG tablet; Take 1 tablet (20 mg total) by mouth daily.  Dispense: 90 tablet; Refill: 3  2. GAD (generalized anxiety disorder) - ALPRAZolam (XANAX) 0.5 MG tablet; Take 1 tablet (0.5 mg total) by mouth 2 (two) times daily as needed for anxiety.  Dispense: 60 tablet;  Refill: 5 - PARoxetine (PAXIL) 40 MG tablet; Take 1 tablet (40 mg total) by mouth daily. (Needs to be seen before next refill)  Dispense: 90 tablet; Refill: 0 - busPIRone (BUSPAR) 15 MG tablet; Take 1 tablet (15 mg total) by mouth 3 (three) times daily.  Dispense: 90 tablet; Refill: 3  3. Severe episode of recurrent major depressive disorder, without psychotic features ([place]) - PARoxetine (PAXIL) 40 MG tablet; Take 1 tablet (40 mg total) by mouth daily. (Needs to be seen before next refill)  Dispense: 90 tablet; Refill: 0  4. Psoriasis  5. Benzodiazepine dependence ([place]) - ToxASSURE Select 13 (MW), Urine  6. Controlled substance agreement signed  - ToxASSURE Select 13 (MW), Urine  7. Current smoker  8. Morbid obesity ([place])   Labs pending Health Maintenance reviewed Diet and exercise encouraged  Follow up plan: 6 months    Evelina Dun, FNP

## 2019-12-08 NOTE — Patient Instructions (Signed)

## 2019-12-13 LAB — TOXASSURE SELECT 13 (MW), URINE

## 2019-12-14 ENCOUNTER — Other Ambulatory Visit: Payer: Self-pay | Admitting: Family

## 2019-12-14 DIAGNOSIS — F411 Generalized anxiety disorder: Secondary | ICD-10-CM

## 2019-12-26 ENCOUNTER — Ambulatory Visit: Payer: Self-pay | Admitting: Family

## 2019-12-31 ENCOUNTER — Other Ambulatory Visit: Payer: Self-pay | Admitting: Family

## 2019-12-31 DIAGNOSIS — F411 Generalized anxiety disorder: Secondary | ICD-10-CM

## 2020-04-04 ENCOUNTER — Other Ambulatory Visit: Payer: Self-pay | Admitting: Family

## 2020-04-04 DIAGNOSIS — F411 Generalized anxiety disorder: Secondary | ICD-10-CM

## 2020-06-07 ENCOUNTER — Encounter: Payer: Self-pay | Admitting: Family

## 2020-06-07 ENCOUNTER — Other Ambulatory Visit: Payer: Self-pay

## 2020-06-07 ENCOUNTER — Ambulatory Visit (INDEPENDENT_AMBULATORY_CARE_PROVIDER_SITE_OTHER): Payer: Self-pay | Admitting: Family

## 2020-06-07 VITALS — BP 124/76 | HR 111 | Temp 96.6°F | Ht 71.0 in | Wt 254.8 lb

## 2020-06-07 DIAGNOSIS — I4892 Unspecified atrial flutter: Secondary | ICD-10-CM

## 2020-06-07 DIAGNOSIS — I10 Essential (primary) hypertension: Secondary | ICD-10-CM

## 2020-06-07 DIAGNOSIS — Z79899 Other long term (current) drug therapy: Secondary | ICD-10-CM

## 2020-06-07 DIAGNOSIS — F411 Generalized anxiety disorder: Secondary | ICD-10-CM

## 2020-06-07 DIAGNOSIS — R000 Tachycardia, unspecified: Secondary | ICD-10-CM

## 2020-06-07 DIAGNOSIS — R42 Dizziness and giddiness: Secondary | ICD-10-CM

## 2020-06-07 DIAGNOSIS — F1320 Sedative, hypnotic or anxiolytic dependence, uncomplicated: Secondary | ICD-10-CM

## 2020-06-07 DIAGNOSIS — R7989 Other specified abnormal findings of blood chemistry: Secondary | ICD-10-CM

## 2020-06-07 DIAGNOSIS — F332 Major depressive disorder, recurrent severe without psychotic features: Secondary | ICD-10-CM

## 2020-06-07 DIAGNOSIS — F17200 Nicotine dependence, unspecified, uncomplicated: Secondary | ICD-10-CM

## 2020-06-07 DIAGNOSIS — L409 Psoriasis, unspecified: Secondary | ICD-10-CM

## 2020-06-07 DIAGNOSIS — R61 Generalized hyperhidrosis: Secondary | ICD-10-CM

## 2020-06-07 MED ORDER — ONDANSETRON HCL 4 MG PO TABS: 4.0000 mg | ORAL_TABLET | Freq: Three times a day (TID) | ORAL | 0 refills | Status: DC | PRN

## 2020-06-07 MED ORDER — ALPRAZOLAM 0.5 MG PO TABS: 0.5000 mg | ORAL_TABLET | Freq: Two times a day (BID) | ORAL | 5 refills | Status: DC | PRN

## 2020-06-07 NOTE — Progress Notes (Addendum)
Subjective:    Patient ID: Jeffery Peters, male    DOB: 1986-06-11, 34 y.o.   MRN: [id number]  Chief Complaint  Patient presents with  . Anxiety  . Hypertension  . Nausea    bending over makes it worse, cramps on both sides when this happened   . Dizziness   Pt presents to the office today for chronic follow up. Pt has psoriasis and is followed by Dermatologists annually.  PT reports he had nausea, vomiting, and diarrhea about 2 weeks ago. He was negative for COVID. He states since then has had dizziness with bending and sweating. Reports he still feels slightly weak.   Anxiety Presents for follow-up visit. Symptoms include depressed mood, dizziness, excessive worry, nausea, nervous/anxious behavior and restlessness. Patient reports no shortness of breath. Symptoms occur most days. The severity of symptoms is moderate. The quality of sleep is good.    Hypertension This is a chronic problem. The current episode started more than 1 year ago. The problem has been resolved since onset. The problem is controlled. Associated symptoms include anxiety and malaise/fatigue. Pertinent negatives include no peripheral edema or shortness of breath. Risk factors for coronary artery disease include dyslipidemia, male gender, obesity and sedentary lifestyle. The current treatment provides moderate improvement.  Dizziness This is a new problem. The current episode started 1 to 4 weeks ago. The problem occurs intermittently. The problem has been waxing and waning. Associated symptoms include fatigue and nausea. Pertinent negatives include no chills or congestion. The symptoms are aggravated by bending. The treatment provided mild relief.      Review of Systems  Constitutional: Positive for fatigue and malaise/fatigue. Negative for chills.  HENT: Negative for congestion.   Respiratory: Negative for shortness of breath.   Gastrointestinal: Positive for nausea.  Neurological: Positive for dizziness.    Psychiatric/Behavioral: The patient is nervous/anxious.   All other systems reviewed and are negative.      Objective:   Physical Exam Vitals reviewed.  Constitutional:      General: He is not in acute distress.    Appearance: He is well-developed.  HENT:     Head: Normocephalic.     Right Ear: Tympanic membrane normal.     Left Ear: Tympanic membrane normal.  Eyes:     General:        Right eye: No discharge.        Left eye: No discharge.     Pupils: Pupils are equal, round, and reactive to light.  Neck:     Thyroid: No thyromegaly.  Cardiovascular:     Rate and Rhythm: Regular rhythm. Tachycardia present.     Heart sounds: Normal heart sounds. No murmur heard.   Pulmonary:     Effort: Pulmonary effort is normal. No respiratory distress.     Breath sounds: Normal breath sounds. No wheezing.  Abdominal:     General: Bowel sounds are normal. There is no distension.     Palpations: Abdomen is soft.     Tenderness: There is no abdominal tenderness.  Musculoskeletal:        General: No tenderness. Normal range of motion.     Cervical back: Normal range of motion and neck supple.  Skin:    General: Skin is warm and dry.     Findings: No erythema or rash.  Neurological:     Mental Status: He is alert and oriented to person, place, and time.     Cranial Nerves: No cranial nerve deficit.  Deep Tendon Reflexes: Reflexes are normal and symmetric.  Psychiatric:        Behavior: Behavior normal.        Thought Content: Thought content normal.        Judgment: Judgment normal.        BP 124/76   Pulse (!) 111   Temp (!) 96.6 F (35.9 C) (Temporal)   Ht 5' 11"  (1.803 m)   Wt 254 lb 12.8 oz (115.6 kg)   SpO2 96%   BMI 35.54 kg/m      Assessment & Plan:  Jeffery Peters comes in today with chief complaint of Anxiety, Hypertension, Nausea (bending over makes it worse, cramps on both sides when this happened ), and Dizziness   Diagnosis and orders addressed:  1.  Essential hypertension - CMP14+EGFR - Ambulatory referral to Cardiology  2. Psoriasis - CMP14+EGFR  3. Benzodiazepine dependence (HCC) - CMP14+EGFR - ALPRAZolam (XANAX) 0.5 MG tablet; Take 1 tablet (0.5 mg total) by mouth 2 (two) times daily as needed for anxiety.  Dispense: 60 tablet; Refill: 5  4. Controlled substance agreement signed - CMP14+EGFR - ALPRAZolam (XANAX) 0.5 MG tablet; Take 1 tablet (0.5 mg total) by mouth 2 (two) times daily as needed for anxiety.  Dispense: 60 tablet; Refill: 5  5. Current smoker - CMP14+EGFR  6. Severe episode of recurrent major depressive disorder, without psychotic features ([place]) - CMP14+EGFR  7. GAD (generalized anxiety disorder - CMP14+EGFR - ALPRAZolam (XANAX) 0.5 MG tablet; Take 1 tablet (0.5 mg total) by mouth 2 (two) times daily as needed for anxiety.  Dispense: 60 tablet; Refill: 5  8. Morbid obesity (HCC) - CMP14+EGFR  9. Dizziness  - CMP14+EGFR - EKG 12-Lead - TSH  10. Excessive sweating  - EKG 12-Lead - TSH  11. Tachycardia - EKG 12-Lead - TSH  12. Atrial flutter, unspecified type ([place]) - Ambulatory referral to Cardiology - TSH     Labs pending Continue current meds- low fat diet and exercise and recheck in 3 months Health Maintenance reviewed Diet and exercise encouraged  Follow up plan: 6 months   Evelina Dun, FNP

## 2020-06-07 NOTE — Addendum Note (Signed)
Addended by: Jannifer Rodney A on: 06/07/2020 11:31 AM   Modules accepted: Orders

## 2020-06-07 NOTE — Patient Instructions (Addendum)
Atrial Flutter  Atrial flutter is a type of abnormal heart rhythm (arrhythmia). The heart has an electrical system that tells it how to beat. In atrial flutter, the signals move rapidly in the top chambers of the heart (the atria). This makes your heart beat very fast. Atrial flutter can come and go, or it can be permanent. The goal of treatment is to prevent blood clots from forming, control your heart rate, or restore your heartbeat to a normal rhythm. If this condition is not treated, it can cause serious problems, such as a weakened heart muscle (cardiomyopathy) or a stroke. What are the causes? This condition is often caused by conditions that damage the heart's electrical system. These include:  Heart conditions and heart surgery. These include heart attacks and open-heart surgery.  Lung problems, such as COPD or a blood clot in the lung (pulmonary embolism, or PE).  Poorly controlled high blood pressure (hypertension).  Overactive thyroid (hyperthyroidism).  Diabetes. In some cases, the cause of this condition is not known. What increases the risk? You are more likely to develop this condition if:  You are an elderly adult.  You are a man.  You are overweight (obese).  You have obstructive sleep apnea.  You have a family history of atrial flutter.  You have diabetes.  You drink a lot of alcohol, especially binge drinking.  You use drugs, including cannabis.  You smoke. What are the signs or symptoms? Symptoms of this condition include:  A feeling that your heart is pounding or racing (palpitations).  Shortness of breath.  Chest pain.  Feeling dizzy or light-headed.  Fainting.  Low blood pressure (hypotension).  Fatigue.  Tiring easily during exercise or activity. In some cases, there are no symptoms. How is this diagnosed? This condition may be diagnosed with:  An electrocardiogram (ECG) to check electrical signals of the heart.  An ambulatory  cardiac monitor to record your heart's activity for a few days.  An echocardiogram to create pictures of your heart.  A transesophageal echocardiogram (TEE) to create even better pictures of your heart.  A stress test to check your blood supply while you exercise.  Imaging tests, such as a CT scan or chest X-ray.  Blood tests. How is this treated? Treatment depends on underlying conditions and how you feel when you experience atrial flutter. This condition may be treated with:  Medicines to prevent blood clots or to treat heart rate or heart rhythm problems.  Electrical cardioversion to reset the heart's rhythm.  Ablation to remove the heart tissue that sends abnormal signals.  Left atrial appendage closure to seal the area where blood clots can form. In some cases, underlying conditions will be treated. Follow these instructions at home: Medicines  Take over-the-counter and prescription medicines only as told by your health care provider.  Do not take any new medicines without talking to your health care provider.  If you are taking blood thinners: ? Talk with your health care provider before you take any medicines that contain aspirin or NSAIDs, such as ibuprofen. These medicines increase your risk for dangerous bleeding. ? Take your medicine exactly as told, at the same time every day. ? Avoid activities that could cause injury or bruising, and follow instructions about how to prevent falls. ? Wear a medical alert bracelet or carry a card that lists what medicines you take. Lifestyle  Eat heart-healthy foods. Talk with a dietitian to make an eating plan that is right for you.  Do   not use any products that contain nicotine or tobacco, such as cigarettes, e-cigarettes, and chewing tobacco. If you need help quitting, ask your health care provider.  Do not drink alcohol.  Do not use drugs, including cannabis.  Lose weight if you are overweight or obese.  Exercise  regularly as instructed by your health care provider. General instructions  Do not use diet pills unless your health care provider approves. Diet pills may make heart problems worse.  If you have obstructive sleep apnea, manage your condition as told by your health care provider.  Keep all follow-up visits as told by your health care provider. This is important. Contact a health care provider if you:  Notice a change in the rate, rhythm, or strength of your heartbeat.  Are taking a blood thinner and you notice more bruising.  Have a sudden change in weight.  Tire more easily when you exercise or do heavy work. Get help right away if you have:  Pain or pressure in your chest.  Shortness of breath.  Fainting.  Increasing sweating with no known cause.  Side effects of blood thinners, such as blood in your vomit, stool, or urine, or bleeding that cannot stop.  Any symptoms of a stroke. "BE FAST" is an easy way to remember the main warning signs of a stroke: ? B - Balance. Signs are dizziness, sudden trouble walking, or loss of balance. ? E - Eyes. Signs are trouble seeing or a sudden change in vision. ? F - Face. Signs are sudden weakness or numbness of the face, or the face or eyelid drooping on one side. ? A - Arms. Signs are weakness or numbness in an arm. This happens suddenly and usually on one side of the body. ? S - Speech. Signs are sudden trouble speaking, slurred speech, or trouble understanding what people say. ? T - Time. Time to call emergency services. Write down what time symptoms started.  Other signs of a stroke, such as: ? A sudden, severe headache with no known cause. ? Nausea or vomiting. ? Seizure.  These symptoms may represent a serious problem that is an emergency. Do not wait to see if the symptoms will go away. Get medical help right away. Call your local emergency services (911 in the [place] Do not drive yourself to the hospital. Summary  Atrial  flutter is an abnormal heart rhythm that can give you symptoms of palpitations, shortness of breath, or fatigue.  Atrial flutter is often treated with medicines to keep your heart in a normal rhythm and to prevent a stroke.  Get help right away if you cannot catch your breath, or have chest pain or pressure.  Get help right away if you have signs or symptoms of a stroke. This information is not intended to replace advice given to you by your health care provider. Make sure you discuss any questions you have with your health care provider. Document Revised: 04/06/2019 Document Reviewed: 04/06/2019 Elsevier Patient Education  2020 Elsevier Inc.  

## 2020-06-08 LAB — CMP14+EGFR
ALT: 71 IU/L — ABNORMAL HIGH (ref 0–44)
AST: 80 IU/L — ABNORMAL HIGH (ref 0–40)
Albumin/Globulin Ratio: 1.6 (ref 1.2–2.2)
Albumin: 5 g/dL (ref 4.0–5.0)
Alkaline Phosphatase: 95 IU/L (ref 48–121)
BUN/Creatinine Ratio: 17 (ref 9–20)
BUN: 26 mg/dL — ABNORMAL HIGH (ref 6–20)
Bilirubin Total: 0.5 mg/dL (ref 0.0–1.2)
CO2: 20 mmol/L (ref 20–29)
Calcium: 11 mg/dL — ABNORMAL HIGH (ref 8.7–10.2)
Chloride: 93 mmol/L — ABNORMAL LOW (ref 96–106)
Creatinine, Ser: 1.49 mg/dL — ABNORMAL HIGH (ref 0.76–1.27)
GFR calc Af Amer: 69 mL/min/{1.73_m2} (ref >59)
GFR calc non Af Amer: 60 mL/min/{1.73_m2} (ref >59)
Globulin, Total: 3.1 g/dL (ref 1.5–4.5)
Glucose: 118 mg/dL — ABNORMAL HIGH (ref 65–99)
Potassium: 5.1 mmol/L (ref 3.5–5.2)
Sodium: 130 mmol/L — ABNORMAL LOW (ref 134–144)
Total Protein: 8.1 g/dL (ref 6.0–8.5)

## 2020-06-08 LAB — TSH: TSH: 0.676 u[IU]/mL (ref 0.450–4.500)

## 2020-06-08 NOTE — Addendum Note (Signed)
Addended by: Adella Hare B on: 06/08/2020 04:29 PM   Modules accepted: Orders

## 2020-06-14 ENCOUNTER — Other Ambulatory Visit: Payer: Self-pay

## 2020-06-14 DIAGNOSIS — R7989 Other specified abnormal findings of blood chemistry: Secondary | ICD-10-CM

## 2020-06-14 LAB — CMP14+EGFR
ALT: 43 IU/L (ref 0–44)
AST: 51 IU/L — ABNORMAL HIGH (ref 0–40)
Albumin/Globulin Ratio: 2 (ref 1.2–2.2)
Albumin: 4.9 g/dL (ref 4.0–5.0)
Alkaline Phosphatase: 83 IU/L (ref 48–121)
BUN/Creatinine Ratio: 17 (ref 9–20)
BUN: 17 mg/dL (ref 6–20)
Bilirubin Total: 0.3 mg/dL (ref 0.0–1.2)
CO2: 19 mmol/L — ABNORMAL LOW (ref 20–29)
Calcium: 10.2 mg/dL (ref 8.7–10.2)
Chloride: 99 mmol/L (ref 96–106)
Creatinine, Ser: 0.99 mg/dL (ref 0.76–1.27)
GFR calc Af Amer: 114 mL/min/{1.73_m2} (ref >59)
GFR calc non Af Amer: 98 mL/min/{1.73_m2} (ref >59)
Globulin, Total: 2.5 g/dL (ref 1.5–4.5)
Glucose: 148 mg/dL — ABNORMAL HIGH (ref 65–99)
Potassium: 4.5 mmol/L (ref 3.5–5.2)
Sodium: 137 mmol/L (ref 134–144)
Total Protein: 7.4 g/dL (ref 6.0–8.5)

## 2020-06-28 ENCOUNTER — Other Ambulatory Visit: Payer: Self-pay | Admitting: Critical Care Medicine

## 2020-06-28 ENCOUNTER — Other Ambulatory Visit: Payer: BLUE CROSS/BLUE SHIELD

## 2020-06-28 DIAGNOSIS — Z20822 Contact with and (suspected) exposure to covid-19: Secondary | ICD-10-CM

## 2020-06-30 LAB — NOVEL CORONAVIRUS, NAA: SARS-CoV-2, NAA: NOT DETECTED

## 2020-08-21 NOTE — Progress Notes (Signed)
Cardiology Office Note   Date:  08/22/2020   ID:  [name], DOB 13-Nov-1985, MRN [id number]  PCP:  Junie Spencer, FNP  Cardiologist:   No primary care provider on file. Referring:  Junie Spencer, FNP   Chief Complaint  Patient presents with  . Abnormal ECG     History of Present Illness: Jeffery Peters is a 34 y.o. male who is referred by Junie Spencer, FNP for evaluation of an abnormal EKG. I reviewed this EKG the computer reading suggested atrial flutter. However, this was incorrect and it was sinus rhythm. The patient has not had any prior cardiac work-up or history. He does have risk factors to include hypertension and tobacco use. 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 He does not have any symptoms with this. The patient denies any new symptoms such as chest discomfort, neck or arm discomfort. There has been no new shortness of breath, PND or orthopnea. There have been no reported palpitations, presyncope or syncope.  Past Medical History:  Diagnosis Date  . Agoraphobia with panic attacks 2009  . Anxiety   . Hypertension   . Psoriasis     History reviewed. No pertinent surgical history.   Current Outpatient Medications  Medication Sig Dispense Refill  . ALPRAZolam (XANAX) 0.5 MG tablet Take 1 tablet (0.5 mg total) by mouth 2 (two) times daily as needed for anxiety. 60 tablet 5  . lisinopril (ZESTRIL) 20 MG tablet Take 1 tablet (20 mg total) by mouth daily. 90 tablet 3  . PARoxetine (PAXIL) 40 MG tablet Take 1 tablet (40 mg total) by mouth daily. (Needs to be seen before next refill) 90 tablet 0   No current facility-administered medications for this visit.    Allergies:   Patient has no known allergies.    Social History:  The patient  reports that he has been smoking. He has been smoking about 1.50 packs per day. He has never used smokeless tobacco. He reports current alcohol use. He reports that he does not use drugs.   Family History:   The patient's family history includes Cancer in his father.    ROS:  Please see the history of present illness.   Otherwise, review of systems are positive for none.   All other systems are reviewed and negative.    PHYSICAL EXAM: VS:  Ht 6' (1.829 m)   Wt 250 lb (113.4 kg)   BMI 33.91 kg/m  , BMI Body mass index is 33.91 kg/m. GENERAL:  Well appearing HEENT:  Pupils equal round and reactive, fundi not visualized, oral mucosa unremarkable NECK:  No jugular venous distention, waveform within normal limits, carotid upstroke brisk and symmetric, no bruits, no thyromegaly LYMPHATICS:  No cervical, inguinal adenopathy LUNGS:  Clear to auscultation bilaterally BACK:  No CVA tenderness CHEST:  Unremarkable HEART:  PMI not displaced or sustained,S1 and S2 within normal limits, no S3, no S4, no clicks, no rubs, no murmurs ABD:  Flat, positive bowel sounds normal in frequency in pitch, no bruits, no rebound, no guarding, no midline pulsatile mass, no hepatomegaly, no splenomegaly EXT:  2 plus pulses throughout, no edema, no cyanosis no clubbing SKIN:  No rashes no nodules NEURO:  Cranial nerves II through XII grossly intact, motor grossly intact throughout PSYCH:  Cognitively intact, oriented to person place and time    EKG:  EKG is ordered today. The ekg ordered today demonstrates sinus rhythm, rate 98, axis  within normal limits, intervals within normal limits, no acute ST-T wave changes.   Recent Labs: 06/07/2020: TSH 0.676 06/14/2020: ALT 43; BUN 17; Creatinine, Ser 0.99; Potassium 4.5; Sodium 137    Lipid Panel    Component Value Date/Time   CHOL 229 (H) 03/31/2016 1219   TRIG 321 (H) 03/31/2016 1219   HDL 41 03/31/2016 1219   CHOLHDL 5.6 (H) 03/31/2016 1219   LDLCALC 124 (H) 03/31/2016 1219      Wt Readings from Last 3 Encounters:  08/22/20 250 lb (113.4 kg)  06/07/20 254 lb 12.8 oz (115.6 kg)  12/08/19 278 lb 12.8 oz (126.5 kg)      Other studies Reviewed: Additional  studies/ records that were reviewed today include: EKG, labs. Review of the above records demonstrates:  Please see elsewhere in the note.     ASSESSMENT AND PLAN:  HTN: Blood pressures well controlled on the meds as listed. No change in therapy.  TOBACCO ABUSE: We had a discussion about need to stop smoking.  ABNORMAL EKG: There was no evidence of atrial flutter. He otherwise has a normal EKG. No further work-up.  DYSLIPIDEMIA: In 2017 he had an elevated triglyceride. His total cholesterol was 229. LDL was 124. I have suggested follow-up lab.  OBESITY: We did discuss weight loss with diet strategies.   Current medicines are reviewed at length with the patient today.  The patient does not have concerns regarding medicines.  The following changes have been made:  no change  Labs/ tests ordered today include:   Orders Placed This Encounter  Procedures  . Lipid panel  . EKG 12-Lead     Disposition:   FU with me as needed.     Signed, Rollene Rotunda, MD  08/22/2020 2:56 PM    [facility]

## 2020-08-22 ENCOUNTER — Encounter: Payer: Self-pay | Admitting: Cardiology

## 2020-08-22 ENCOUNTER — Ambulatory Visit (INDEPENDENT_AMBULATORY_CARE_PROVIDER_SITE_OTHER): Payer: Self-pay | Admitting: Cardiology

## 2020-08-22 ENCOUNTER — Other Ambulatory Visit: Payer: Self-pay

## 2020-08-22 VITALS — BP 128/80 | HR 98 | Ht 72.0 in | Wt 250.0 lb

## 2020-08-22 DIAGNOSIS — Z0000 Encounter for general adult medical examination without abnormal findings: Secondary | ICD-10-CM

## 2020-08-22 DIAGNOSIS — I10 Essential (primary) hypertension: Secondary | ICD-10-CM

## 2020-08-22 NOTE — Patient Instructions (Signed)
Medication Instructions:  The current medical regimen is effective;  continue present plan and medications.  *If you need a refill on your cardiac medications before your next appointment, please call your pharmacy*  Lab Work: Please have fasting blood work at your primary care Dr's office. (Lipid)  If you have labs (blood work) drawn today and your tests are completely normal, you will receive your results only by: Marland Kitchen MyChart Message (if you have MyChart) OR . A paper copy in the mail If you have any lab test that is abnormal or we need to change your treatment, we will call you to review the results.  Follow-Up: At [facility], you and your health needs are our priority.  As part of our continuing mission to provide you with exceptional heart care, we have created designated Provider Care Teams.  These Care Teams include your primary Cardiologist (physician) and Advanced Practice Providers (APPs -  Physician Assistants and Nurse Practitioners) who all work together to provide you with the care you need, when you need it.  We recommend signing up for the patient portal called "MyChart".  Sign up information is provided on this After Visit Summary.  MyChart is used to connect with patients for Virtual Visits (Telemedicine).  Patients are able to view lab/test results, encounter notes, upcoming appointments, etc.  Non-urgent messages can be sent to your provider as well.   To learn more about what you can do with MyChart, go to [email or web address].    Your next appointment:   Follow up as needed with Dr Antoine Poche.  Thank you for choosing [facility]!!

## 2020-11-09 ENCOUNTER — Other Ambulatory Visit: Payer: Self-pay | Admitting: Family

## 2020-11-09 DIAGNOSIS — F411 Generalized anxiety disorder: Secondary | ICD-10-CM

## 2020-11-09 DIAGNOSIS — F332 Major depressive disorder, recurrent severe without psychotic features: Secondary | ICD-10-CM

## 2020-11-10 ENCOUNTER — Other Ambulatory Visit: Payer: Self-pay | Admitting: Family

## 2020-11-10 DIAGNOSIS — F411 Generalized anxiety disorder: Secondary | ICD-10-CM

## 2020-11-10 DIAGNOSIS — F332 Major depressive disorder, recurrent severe without psychotic features: Secondary | ICD-10-CM

## 2020-11-14 ENCOUNTER — Telehealth: Payer: Self-pay

## 2020-11-14 DIAGNOSIS — F332 Major depressive disorder, recurrent severe without psychotic features: Secondary | ICD-10-CM

## 2020-11-14 DIAGNOSIS — F411 Generalized anxiety disorder: Secondary | ICD-10-CM

## 2020-11-14 NOTE — Telephone Encounter (Signed)
rc for nurse 

## 2020-11-14 NOTE — Telephone Encounter (Signed)
  Prescription Request  11/14/2020  What is the name of the medication or equipment? PARoxetine (PAXIL) 40 MG tablet    Have you contacted your pharmacy to request a refill? (if applicable) yes  Which pharmacy would you like this sent to? [facility], pt has been out for two days and needs this for work    Patient notified that their request is being sent to the clinical staff for review and that they should receive a response within 2 business days.

## 2020-11-14 NOTE — Telephone Encounter (Signed)
Attempted to contact - NVM 

## 2020-11-15 MED ORDER — PAROXETINE HCL 40 MG PO TABS: 40.0000 mg | ORAL_TABLET | Freq: Every day | ORAL | 0 refills | Status: DC

## 2020-11-15 NOTE — Telephone Encounter (Signed)
Pt aware 30 days sent to pharmacy. Appt 12/10/20

## 2020-12-07 ENCOUNTER — Other Ambulatory Visit: Payer: Self-pay | Admitting: Family

## 2020-12-07 DIAGNOSIS — F411 Generalized anxiety disorder: Secondary | ICD-10-CM

## 2020-12-07 DIAGNOSIS — F332 Major depressive disorder, recurrent severe without psychotic features: Secondary | ICD-10-CM

## 2020-12-10 ENCOUNTER — Other Ambulatory Visit: Payer: Self-pay

## 2020-12-10 ENCOUNTER — Other Ambulatory Visit: Payer: Self-pay | Admitting: Family

## 2020-12-10 ENCOUNTER — Ambulatory Visit (INDEPENDENT_AMBULATORY_CARE_PROVIDER_SITE_OTHER): Payer: Self-pay | Admitting: Family

## 2020-12-10 ENCOUNTER — Encounter: Payer: Self-pay | Admitting: Family

## 2020-12-10 VITALS — BP 134/82 | HR 89 | Temp 97.8°F | Ht 72.0 in | Wt 251.0 lb

## 2020-12-10 DIAGNOSIS — F17200 Nicotine dependence, unspecified, uncomplicated: Secondary | ICD-10-CM

## 2020-12-10 DIAGNOSIS — Z79899 Other long term (current) drug therapy: Secondary | ICD-10-CM

## 2020-12-10 DIAGNOSIS — F411 Generalized anxiety disorder: Secondary | ICD-10-CM

## 2020-12-10 DIAGNOSIS — F332 Major depressive disorder, recurrent severe without psychotic features: Secondary | ICD-10-CM

## 2020-12-10 DIAGNOSIS — F1320 Sedative, hypnotic or anxiolytic dependence, uncomplicated: Secondary | ICD-10-CM

## 2020-12-10 DIAGNOSIS — E669 Obesity, unspecified: Secondary | ICD-10-CM

## 2020-12-10 DIAGNOSIS — I10 Essential (primary) hypertension: Secondary | ICD-10-CM

## 2020-12-10 DIAGNOSIS — L409 Psoriasis, unspecified: Secondary | ICD-10-CM

## 2020-12-10 MED ORDER — ALPRAZOLAM 0.5 MG PO TABS: 0.5000 mg | ORAL_TABLET | Freq: Two times a day (BID) | ORAL | 5 refills | Status: DC | PRN

## 2020-12-10 MED ORDER — PAROXETINE HCL 40 MG PO TABS: 40.0000 mg | ORAL_TABLET | Freq: Every day | ORAL | 6 refills | Status: DC

## 2020-12-10 NOTE — Patient Instructions (Signed)
Psoriasis Psoriasis is a long-term (chronic) skin condition. It occurs because your immune system causes skin cells to form too quickly. As a result, too many skin cells grow and create raised, red patches (plaques) that often look silvery on your skin. Plaques may show up anywhere on your body. They can be any size or shape. Symptoms of this condition range from mild to very severe. Psoriasis cannot be passed from one person to another (is not contagious). Sometimes, the symptoms go away and then come back again. What are the causes? The cause of psoriasis is not known, but certain factors can make the condition worse. These include:  Damage or trauma to the skin, such as cuts, scrapes, sunburn, and dryness.  Not enough exposure to sunlight.  Certain medicines.  Alcohol.  Tobacco use.  Stress.  Infections caused by bacteria or viruses. What increases the risk? You are more likely to develop this condition if you:  Have a family history of psoriasis.  Are obese.  Are 20-40 years old.  Are taking certain medicines. What are the signs or symptoms? There are different types of psoriasis. You can have more than one type of psoriasis during your life. The types are:  Plaque. This is the most common.  Guttate. This is also called eruptive psoriasis.  Inverse.  Pustular.  Erythrodermic.  Sebopsoriasis.  Psoriatic arthritis. Each type of psoriasis has different symptoms.  Plaque psoriasis symptoms include red, raised plaques with a silvery-white coating (scale). These plaques may be itchy. Your nails may be pitted and crumbly or fall off.  Guttate psoriasis symptoms include small red spots that often show up on your trunk, arms, and legs. These spots may develop after you have been sick, especially with strep throat.  Inverse psoriasis symptoms include plaques in your underarm area, under your breasts, or on your genitals, groin, or buttocks.  Pustular psoriasis symptoms  include pus-filled bumps that are painful, red, and swollen on the palms of your hands or the soles of your feet. You also may feel exhausted, feverish, weak, or have no appetite.  Erythrodermic psoriasis symptoms include bright red skin that may look burned. You may have a fast heartbeat and a body temperature that is too high or too low. You may be itchy or in pain.  Sebopsoriasis symptoms include red plaques that have a greasy coating, and are often on your scalp, forehead, and face.  Psoriatic arthritis causes swollen, painful joints along with scaly skin plaques.   How is this diagnosed? This condition is diagnosed based on your symptoms, family history, and a physical exam.  You may also be referred to a health care provider who specializes in skin diseases (dermatologist).  Your health care provider may remove a tissue sample (biopsy) for testing. How is this treated? There is no cure for this condition, but treatment can help manage it. Goals of treatment include:  Helping your skin heal.  Reducing itching and inflammation.  Slowing the growth of new skin cells.  Helping your immune system respond better to your skin. Treatment varies, depending on the severity of your condition. This condition may be treated by:  Creams or ointments to help with symptoms.  Ultraviolet ray exposure (light therapy or phototherapy). This may include natural sunlight or light therapy in a medical office.  Medicines (systemic therapy). These medicines can help your body better manage skin cell turnover and inflammation. Medicines may be given in the form of pills or injections. They may be used along   with light therapy or ointments. You may also get antibiotic medicines if you have an infection. Follow these instructions at home: Skin Care  Moisturize your skin as needed. Only use moisturizers that have been approved by your health care provider.  Apply cool, wet cloths (cold compresses) to the  affected areas.  Do not use a hot tub or take hot showers. Take lukewarm showers and baths.  Do not scratch your skin. Lifestyle  Do not use any products that contain nicotine or tobacco, such as cigarettes, e-cigarettes, and chewing tobacco. If you need help quitting, ask your health care provider.  Use techniques for stress reduction, such as meditation or yoga.  Maintain a healthy weight. Follow instructions from your health care provider for weight control. These may include dietary restrictions.  Get safe exposure to the sun as told by your health care provider. This may include spending regular intervals of time outdoors in sunlight. Do not get sunburned.  Consider joining a psoriasis support group.   Medicines  Take or use over-the-counter and prescription medicines only as told by your health care provider.  If you were prescribed an antibiotic medicine, take it as told by your health care provider. Do not stop using the antibiotic even if you start to feel better. Alcohol use  Limit how much you use: ? 0-1 drink a day for women. ? 0-2 drinks a day for men.  Be aware of how much alcohol is in your drink. In the [place] one drink equals one 12 oz bottle of beer (355 mL), one 5 oz glass of wine (148 mL), or one 1 oz glass of hard liquor (44 mL). General instructions  Keep a journal to help track what triggers an outbreak. Try to avoid any triggers.  See a counselor if feelings of sadness, frustration, and hopelessness about your condition are interfering with your work and relationships.  Keep all follow-up visits as told by your health care provider. This is important. Contact a health care provider if:  You have a fever.  Your pain gets worse.  You have increasing redness or warmth in the affected areas.  You have new or worsening pain or stiffness in your joints.  Your nails start to break easily or pull away from the nail bed.  You feel  depressed. Summary  Psoriasis is a long-term (chronic) skin condition. Patches (plaques) may show up anywhere on your body.  There is no cure for this condition, but treatment can help manage it. Treatment varies, depending on the severity of your condition.  Keep a journal to track what triggers an outbreak. Try to avoid any triggers.  Take or use over-the-counter and prescription medicines only as told by your health care provider.  Keep all follow-up visits as told by your health care provider. This is important. This information is not intended to replace advice given to you by your health care provider. Make sure you discuss any questions you have with your health care provider. Document Revised: 08/17/2018 Document Reviewed: 08/17/2018 Elsevier Patient Education  2021 [employer] Inc.  

## 2020-12-10 NOTE — Progress Notes (Signed)
Subjective:    Patient ID: Jeffery Peters, male    DOB: 1986/10/10, 35 y.o.   MRN: [id number]  Chief Complaint  Patient presents with  . Hypertension  . Psoriasis    Wants to be out of work. Because chemicals or making it worse so he can heel up.   Pt presents to the office today for chronic follow up. Pt has psoriasis and is followed by Dermatologists annually. Hypertension This is a chronic problem. The current episode started more than 1 year ago. The problem has been resolved since onset. The problem is controlled. Associated symptoms include anxiety. Pertinent negatives include no malaise/fatigue, peripheral edema or shortness of breath. Risk factors for coronary artery disease include dyslipidemia, obesity, male gender and sedentary lifestyle. The current treatment provides moderate improvement. There is no history of heart failure.  Psoriasis This is a chronic problem. The affected locations include the head, face, right hand, left hand, left arm and right arm. Associated symptoms include itching, pain and plaques.  Anxiety Presents for follow-up visit. Symptoms include depressed mood, excessive worry, irritability, nervous/anxious behavior, panic and restlessness. Patient reports no shortness of breath. Symptoms occur occasionally. The severity of symptoms is moderate.    Depression        This is a chronic problem.  The current episode started more than 1 year ago.   The onset quality is gradual.   The problem occurs intermittently.  Associated symptoms include irritable, restlessness and sad.  Associated symptoms include no helplessness and no hopelessness.  Past treatments include SSRIs - Selective serotonin reuptake inhibitors.  Past medical history includes anxiety.   Nicotine Dependence Presents for follow-up visit. Symptoms include irritability. His urge triggers include company of smokers. The symptoms have been stable. He smokes 1 pack of cigarettes per day.      Review  of Systems  Constitutional: Positive for irritability. Negative for malaise/fatigue.  Respiratory: Negative for shortness of breath.   Skin: Positive for itching.  Psychiatric/Behavioral: Positive for depression. The patient is nervous/anxious.   All other systems reviewed and are negative.      Objective:   Physical Exam Vitals reviewed.  Constitutional:      General: He is irritable. He is not in acute distress.    Appearance: He is well-developed and well-nourished.  HENT:     Head: Normocephalic.     Right Ear: Tympanic membrane normal.     Left Ear: Tympanic membrane normal.     Mouth/Throat:     Mouth: Oropharynx is clear and moist.  Eyes:     General:        Right eye: No discharge.        Left eye: No discharge.     Pupils: Pupils are equal, round, and reactive to light.  Neck:     Thyroid: No thyromegaly.  Cardiovascular:     Rate and Rhythm: Normal rate and regular rhythm.     Pulses: Intact distal pulses.     Heart sounds: Normal heart sounds. No murmur heard.   Pulmonary:     Effort: Pulmonary effort is normal. No respiratory distress.     Breath sounds: Normal breath sounds. No wheezing.  Abdominal:     General: Bowel sounds are normal. There is no distension.     Palpations: Abdomen is soft.     Tenderness: There is no abdominal tenderness.  Musculoskeletal:        General: No tenderness or edema. Normal range of motion.  Cervical back: Normal range of motion and neck supple.  Skin:    General: Skin is warm and dry.     Findings: Rash present. No erythema.     Comments: Plaques, erythemas, scaly rash on bilateral hands  Neurological:     Mental Status: He is alert and oriented to person, place, and time.     Cranial Nerves: No cranial nerve deficit.     Deep Tendon Reflexes: Reflexes are normal and symmetric.  Psychiatric:        Mood and Affect: Mood and affect normal.        Behavior: Behavior normal.        Thought Content: Thought content  normal.        Judgment: Judgment normal.       BP 134/82   Pulse 89   Temp 97.8 F (36.6 C) (Temporal)   Ht 6' (1.829 m)   Wt 251 lb (113.9 kg)   BMI 34.04 kg/m      Assessment & Plan:  Jeffery Peters comes in today with chief complaint of Hypertension and Psoriasis (Wants to be out of work. Because chemicals or making it worse so he can heel up.)   Diagnosis and orders addressed:  1. Essential hypertension  2. Benzodiazepine dependence (HCC) - ALPRAZolam (XANAX) 0.5 MG tablet; Take 1 tablet (0.5 mg total) by mouth 2 (two) times daily as needed for anxiety.  Dispense: 60 tablet; Refill: 5  3. Controlled substance agreement signed - ALPRAZolam (XANAX) 0.5 MG tablet; Take 1 tablet (0.5 mg total) by mouth 2 (two) times daily as needed for anxiety.  Dispense: 60 tablet; Refill: 5  4. GAD (generalized anxiety disorder) - ALPRAZolam (XANAX) 0.5 MG tablet; Take 1 tablet (0.5 mg total) by mouth 2 (two) times daily as needed for anxiety.  Dispense: 60 tablet; Refill: 5 - PARoxetine (PAXIL) 40 MG tablet; Take 1 tablet (40 mg total) by mouth daily.  Dispense: 30 tablet; Refill: 6  5. Severe episode of recurrent major depressive disorder, without psychotic features (HCC) - PARoxetine (PAXIL) 40 MG tablet; Take 1 tablet (40 mg total) by mouth daily.  Dispense: 30 tablet; Refill: 6  6. Psoriasis  7. Obesity (BMI 30-39.9)  8. Current smoker   Patient reviewed in [place] controlled database, no flags noted. Contract and drug screen are up to date.  Health Maintenance reviewed Diet and exercise encouraged  Follow up plan: 6 months   Jannifer Rodney, FNP

## 2020-12-21 ENCOUNTER — Telehealth: Payer: Self-pay | Admitting: Family Medicine

## 2020-12-24 NOTE — Telephone Encounter (Signed)
Ok for note 

## 2020-12-31 ENCOUNTER — Encounter: Payer: Self-pay | Admitting: Family

## 2020-12-31 ENCOUNTER — Telehealth: Payer: Self-pay

## 2020-12-31 NOTE — Telephone Encounter (Signed)
Letter up front for patient to pick up.

## 2021-01-04 DIAGNOSIS — Z029 Encounter for administrative examinations, unspecified: Secondary | ICD-10-CM

## 2021-01-29 ENCOUNTER — Other Ambulatory Visit (INDEPENDENT_AMBULATORY_CARE_PROVIDER_SITE_OTHER): Payer: Self-pay | Admitting: *Deleted

## 2021-01-29 ENCOUNTER — Ambulatory Visit: Payer: Self-pay | Admitting: *Deleted

## 2021-01-29 ENCOUNTER — Other Ambulatory Visit: Payer: Self-pay

## 2021-01-29 DIAGNOSIS — Z111 Encounter for screening for respiratory tuberculosis: Secondary | ICD-10-CM

## 2021-01-29 DIAGNOSIS — Z23 Encounter for immunization: Secondary | ICD-10-CM

## 2021-01-29 DIAGNOSIS — L409 Psoriasis, unspecified: Secondary | ICD-10-CM

## 2021-01-29 NOTE — Progress Notes (Signed)
PPD placed on right forearm.

## 2021-01-31 LAB — TB SKIN TEST
Induration: 0 mm
TB Skin Test: NEGATIVE

## 2021-05-03 ENCOUNTER — Other Ambulatory Visit: Payer: Self-pay

## 2021-05-03 ENCOUNTER — Ambulatory Visit (INDEPENDENT_AMBULATORY_CARE_PROVIDER_SITE_OTHER): Payer: Self-pay | Admitting: Family Medicine

## 2021-05-03 ENCOUNTER — Encounter: Payer: Self-pay | Admitting: Family Medicine

## 2021-05-03 VITALS — BP 139/86 | HR 96 | Temp 97.8°F | Ht 72.0 in | Wt 243.6 lb

## 2021-05-03 DIAGNOSIS — R42 Dizziness and giddiness: Secondary | ICD-10-CM

## 2021-05-03 DIAGNOSIS — S0990XA Unspecified injury of head, initial encounter: Secondary | ICD-10-CM

## 2021-05-03 DIAGNOSIS — R1110 Vomiting, unspecified: Secondary | ICD-10-CM
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
## 2021-05-08 ENCOUNTER — Other Ambulatory Visit: Payer: Self-pay

## 2021-05-08 ENCOUNTER — Encounter: Payer: Self-pay | Admitting: Family Medicine

## 2021-05-08 ENCOUNTER — Ambulatory Visit: Payer: Self-pay | Admitting: Family Medicine

## 2021-05-08 VITALS — BP 133/82 | HR 98 | Temp 97.1°F | Resp 20 | Ht 72.0 in | Wt 239.0 lb

## 2021-05-08 DIAGNOSIS — S0990XD Unspecified injury of head, subsequent encounter: Secondary | ICD-10-CM

## 2021-05-08 NOTE — Patient Instructions (Signed)
[employer] [facility] (pp. 2876-2897). [place], [place]. Elsevier."> Neurosurgery, 80(1), 6-15. Retrieved on April 17, 2019.[email or web address]"> Primary Care (5th ed., pp. 218-221). [place], [place]: Elsevier."> Rosen's Emergency Medicine: Concepts and Clinical Practice (9th ed., pp. 301-329). [place], [place]: Elsevier."> Neurosurgery, 75 Suppl 1, S3-15. Retrieved on April 17, 2019.[email or web address]">  Head Injury, Adult There are many types of head injuries. Head injuries can be as minor as a small bump, or they can be a serious medical issue. More severe head injuries include: A jarring injury to the brain (concussion). A bruise (contusion) of the brain. This means there is bleeding in the brain that can cause swelling. A cracked skull (skull fracture). Bleeding in the brain that collects, clots, and forms a bump (hematoma). After a head injury, most problems occur within the first 24 hours, but side effects may occur up to 7-10 days after the injury. It is important to watch your condition for any changes. You may need to be observed in the emergencydepartment or urgent care, or you may be admitted to the hospital. What are the causes? There are many possible causes of a head injury. Serious head injuries may be caused by car accidents, bicycle or motorcycle accidents, sports injuries,falls, or being struck by an object. What are the symptoms? Symptoms of a head injury include a contusion, bump, or bleeding at the site of the injury. Other physical symptoms may include: Headache. Nausea or vomiting. Dizziness. Blurred or double vision. Being uncomfortable around bright lights or loud noises. Seizures. Feeling tired. Trouble being awakened. Loss of consciousness. Mental or emotional symptoms may include: Irritability. Confusion and memory problems. Poor attention and concentration. Changes in eating or sleeping  habits. Anxiety or depression. How is this diagnosed? This condition can usually be diagnosed based on your symptoms, a description of the injury, and a physical exam. You may also have imaging tests done, suchas a CT scan or an MRI. How is this treated? Treatment for this condition depends on the severity and type of injury you have. The main goal of treatment is to prevent complications and allow thebrain time to heal. Mild head injury If you have a mild head injury, you may be sent home, and treatment may include: Observation. A responsible adult should stay with you for 24 hours after your injury and check on you often. Physical rest. Brain rest. Pain medicines. Severe head injury If you have a severe head injury, treatment may include: Close observation. This includes hospitalization with the following care: Frequent physical exams. Frequent checks of how your brain and nervous system are working (neurological status). Checking your blood pressure and oxygen levels. Medicines to relieve pain, prevent seizures, and decrease brain swelling. Airway protection and breathing support. This may include using a ventilator. Treatments that monitor and manage swelling inside the brain. Brain surgery. This may be needed to: Remove a collection of blood or blood clots. Stop the bleeding. Remove a part of the skull to allow room for the brain to swell. Follow these instructions at home: Activity Rest and avoid activities that are physically hard or tiring. Make sure you get enough sleep. Let your brain rest by limiting activities that require a lot of thought or attention, such as: Watching TV. Playing memory games and puzzles. Job-related work or homework. Working on the computer, using social media, and texting. Avoid activities that could cause another head injury, such as playing sports, until your health care provider approves. Having another head injury, especially before the first    one has healed, can be dangerous. Ask your health care provider when it is safe for you to return to your regular activities, including work or school. Ask your health care provider for a step-by-step plan for gradually returning to activities. Ask your health care provider when you can drive, ride a bicycle, or use heavy machinery. Your ability to react may be slower after a brain injury. Do not do these activities if you are dizzy. Lifestyle  Do not drink alcohol until your health care provider approves. Do not use drugs. Alcohol and certain drugs may slow your recovery and can put you at risk of further injury. If it is harder than usual to remember things, write them down. If you are easily distracted, try to do one thing at a time. Talk with family members or close friends when making important decisions. Tell your friends, family, a trusted colleague, and work manager about your injury, symptoms, and restrictions. Have them watch for any new or worsening problems.  General instructions Take over-the-counter and prescription medicines only as told by your health care provider. Have someone stay with you for 24 hours after your head injury. This person should watch you for any changes in your symptoms and be ready to seek medical help. Keep all follow-up visits as told by your health care provider. This is important. How is this prevented? Work on improving your balance and strength to avoid falls. Wear a seat belt when you are in a moving vehicle. Wear a helmet when riding a bicycle, skiing, or doing any other sport or activity that has a risk of injury. If you drink alcohol: Limit how much you use to: 0-1 drink a day for nonpregnant women. 0-2 drinks a day for men. Be aware of how much alcohol is in your drink. In the [place] one drink equals one 12 oz bottle of beer (355 mL), one 5 oz glass of wine (148 mL), or one 1 oz glass of hard liquor (44 mL). Take safety measures in your home,  such as: Removing clutter and tripping hazards from floors and stairways. Using grab bars in bathrooms and handrails by stairs. Placing non-slip mats on floors and in bathtubs. Improving lighting in dim areas. Where to find more information Centers for Disease Control and Prevention: www.cdc.gov Get help right away if: You have: A severe headache that is not helped by medicine. Trouble walking or weakness in your arms and legs. Clear or bloody fluid coming from your nose or ears. Changes in your vision. A seizure. Increased confusion or irritability. Your symptoms get worse. You are sleepier than normal and have trouble staying awake. You lose your balance. Your pupils change size. Your speech is slurred. Your dizziness gets worse. You vomit. These symptoms may represent a serious problem that is an emergency. Do not wait to see if the symptoms will go away. Get medical help right away. Call your local emergency services (911 in the [place] Do not drive yourself to the hospital. Summary Head injuries can be minor, or they can be a serious medical issue requiring immediate attention. Treatment for this condition depends on the severity and type of injury you have. Have someone stay with you for 24 hours after your injury and check on you often. Ask your health care provider when it is safe for you to return to your regular activities, including work or school. Head injury prevention includes wearing a seat belt in a motor vehicle, using a helmet on a bicycle,   limiting alcohol use, and taking safety measures in your home. This information is not intended to replace advice given to you by your health care provider. Make sure you discuss any questions you have with your healthcare provider. Document Revised: 08/26/2019 Document Reviewed: 08/26/2019 Elsevier Patient Education  2022 [employer]  

## 2021-05-08 NOTE — Progress Notes (Signed)
Established Patient Office Visit  Subjective:  Patient ID: Jeffery Peters, male    DOB: 1986/07/20  Age: 35 y.o. MRN: [id number]  CC:  Chief Complaint  Patient presents with   Head Injury    HPI Jeffery Peters presents for follow up from a head injury last week. He reports feeling better. He has been resting as much as possible. He denies dizziness, vomiting, changes in vision, focal weakness, changes in gait or balance. He would like to try to go back to work on Sunday. He needs a note for this.   Past Medical History:  Diagnosis Date   Agoraphobia with panic attacks 2009   Anxiety    Hypertension    Psoriasis     No past surgical history on file.  Family History  Problem Relation Age of Onset   Cancer Father        lung    Social History   Socioeconomic History   Marital status: Single    Spouse name: Not on file   Number of children: Not on file   Years of education: Not on file   Highest education level: Not on file  Occupational History   Occupation: cook  Tobacco Use   Smoking status: Heavy Smoker    Packs/day: 1.50    Pack years: 0.00    Types: Cigarettes   Smokeless tobacco: Never  Vaping Use   Vaping Use: Never used  Substance and Sexual Activity   Alcohol use: Yes   Drug use: No   Sexual activity: Not on file  Other Topics Concern   Not on file  Social History Narrative   Lives alone.  Works [employer].     Social Determinants of Health   Financial Resource Strain: Not on file  Food Insecurity: Not on file  Transportation Needs: Not on file  Physical Activity: Not on file  Stress: Not on file  Social Connections: Not on file  Intimate Partner Violence: Not on file    Outpatient Medications Prior to Visit  Medication Sig Dispense Refill   ALPRAZolam (XANAX) 0.5 MG tablet Take 1 tablet (0.5 mg total) by mouth 2 (two) times daily as needed for anxiety. 60 tablet 5   lisinopril (ZESTRIL) 20 MG tablet Take 1 tablet (20 mg total) by mouth daily.  90 tablet 3   PARoxetine (PAXIL) 40 MG tablet TAKE 1 TABLET BY MOUTH EVERY DAY 90 tablet 1   No facility-administered medications prior to visit.    No Known Allergies  ROS Review of Systems As per HPI.    Objective:    Physical Exam Vitals and nursing note reviewed.  Constitutional:      General: He is not in acute distress.    Appearance: He is not ill-appearing, toxic-appearing or diaphoretic.  HENT:     Head:     Comments: Soft tissue swelling and bruising to left forehead Eyes:     Extraocular Movements: Extraocular movements intact.     Pupils: Pupils are equal, round, and reactive to light.  Cardiovascular:     Rate and Rhythm: Normal rate and regular rhythm.     Heart sounds: Normal heart sounds. No murmur heard. Pulmonary:     Effort: Pulmonary effort is normal. No respiratory distress.     Breath sounds: Normal breath sounds.  Musculoskeletal:     Cervical back: No rigidity.  Skin:    General: Skin is warm and dry.  Neurological:     General: No focal  deficit present.     Mental Status: He is alert and oriented to person, place, and time.     Motor: No weakness.     Gait: Gait normal.  Psychiatric:        Mood and Affect: Mood normal.        Behavior: Behavior normal.    BP 133/82   Pulse 98   Temp (!) 97.1 F (36.2 C) (Temporal)   Resp 20   Ht 6' (1.829 m)   Wt 239 lb (108.4 kg)   SpO2 95%   BMI 32.41 kg/m  Wt Readings from Last 3 Encounters:  05/08/21 239 lb (108.4 kg)  05/03/21 243 lb 9.6 oz (110.5 kg)  12/10/20 251 lb (113.9 kg)     Health Maintenance Due  Topic Date Due   COVID-19 Vaccine (1) Never done   Pneumococcal Vaccine 32-36 Years old (1 - PCV) Never done    There are no preventive care reminders to display for this patient.  Lab Results  Component Value Date   TSH 0.676 06/07/2020   Lab Results  Component Value Date   WBC 10.5 01/03/2010   HGB 14.2 01/03/2010   HCT 40.9 01/03/2010   MCV 90.6 01/03/2010   PLT 202  01/03/2010   Lab Results  Component Value Date   NA 137 06/14/2020   K 4.5 06/14/2020   CO2 19 (L) 06/14/2020   GLUCOSE 148 (H) 06/14/2020   BUN 17 06/14/2020   CREATININE 0.99 06/14/2020   BILITOT 0.3 06/14/2020   ALKPHOS 83 06/14/2020   AST 51 (H) 06/14/2020   ALT 43 06/14/2020   PROT 7.4 06/14/2020   ALBUMIN 4.9 06/14/2020   CALCIUM 10.2 06/14/2020   Lab Results  Component Value Date   CHOL 229 (H) 03/31/2016   Lab Results  Component Value Date   HDL 41 03/31/2016   Lab Results  Component Value Date   LDLCALC 124 (H) 03/31/2016   Lab Results  Component Value Date   TRIG 321 (H) 03/31/2016   Lab Results  Component Value Date   CHOLHDL 5.6 (H) 03/31/2016   No results found for: HGBA1C    Assessment & Plan:   Coby was seen today for head injury.  Diagnoses and all orders for this visit:  Injury of head, subsequent encounter Asymptomatic today. Note provided to return to work at the end of the week. He has a follow up appointment with PCP in 4 weeks.   Follow-up: Return if symptoms worsen or fail to improve.   The patient indicates understanding of these issues and agrees with the plan.  Gabriel Earing, FNP

## 2021-06-11 ENCOUNTER — Other Ambulatory Visit: Payer: Self-pay

## 2021-06-11 ENCOUNTER — Ambulatory Visit (INDEPENDENT_AMBULATORY_CARE_PROVIDER_SITE_OTHER): Payer: Self-pay | Admitting: Family

## 2021-06-11 ENCOUNTER — Encounter: Payer: Self-pay | Admitting: Family

## 2021-06-11 VITALS — BP 132/88 | HR 99 | Temp 97.8°F | Wt 243.0 lb

## 2021-06-11 DIAGNOSIS — E669 Obesity, unspecified: Secondary | ICD-10-CM

## 2021-06-11 DIAGNOSIS — F17200 Nicotine dependence, unspecified, uncomplicated: Secondary | ICD-10-CM

## 2021-06-11 DIAGNOSIS — F411 Generalized anxiety disorder: Secondary | ICD-10-CM

## 2021-06-11 DIAGNOSIS — F1320 Sedative, hypnotic or anxiolytic dependence, uncomplicated: Secondary | ICD-10-CM

## 2021-06-11 DIAGNOSIS — K047 Periapical abscess without sinus: Secondary | ICD-10-CM

## 2021-06-11 DIAGNOSIS — I10 Essential (primary) hypertension: Secondary | ICD-10-CM

## 2021-06-11 DIAGNOSIS — L409 Psoriasis, unspecified: Secondary | ICD-10-CM

## 2021-06-11 DIAGNOSIS — F332 Major depressive disorder, recurrent severe without psychotic features: Secondary | ICD-10-CM

## 2021-06-11 DIAGNOSIS — Z79899 Other long term (current) drug therapy: Secondary | ICD-10-CM

## 2021-06-11 MED ORDER — LISINOPRIL 20 MG PO TABS: 20.0000 mg | ORAL_TABLET | Freq: Every day | ORAL | 3 refills | Status: DC

## 2021-06-11 MED ORDER — ALPRAZOLAM 0.5 MG PO TABS: 0.5000 mg | ORAL_TABLET | Freq: Two times a day (BID) | ORAL | 5 refills | Status: DC | PRN

## 2021-06-11 MED ORDER — AMOXICILLIN-POT CLAVULANATE 875-125 MG PO TABS: 1.0000 | ORAL_TABLET | Freq: Two times a day (BID) | ORAL | 0 refills | Status: DC

## 2021-06-11 NOTE — Progress Notes (Signed)
Subjective:    Patient ID: Jeffery Peters, male    DOB: Oct 09, 1986, 35 y.o.   MRN: [id number]  Chief Complaint  Patient presents with   Medical Management of Chronic Issues    WANTS AN RX FOR TOOTH PAIN [facility]   Pt presents to the office today for chronic follow up. Pt has psoriasis and is followed by Dermatologists annually. He is also complaining tooth abscess of right upper jaw that was swollen and tender. He has a Pharmacist, community appoint 06/24/21. Hypertension This is a chronic problem. The current episode started more than 1 year ago. The problem has been resolved since onset. The problem is controlled. Associated symptoms include anxiety. Pertinent negatives include no malaise/fatigue, peripheral edema or shortness of breath. Risk factors for coronary artery disease include dyslipidemia, obesity and sedentary lifestyle. The current treatment provides moderate improvement.  Anxiety Presents for follow-up visit. Symptoms include depressed mood, excessive worry, irritability, nervous/anxious behavior and restlessness. Patient reports no shortness of breath. Symptoms occur most days. The severity of symptoms is moderate.    Depression        This is a chronic problem.  The current episode started more than 1 year ago.   The onset quality is gradual.   The problem occurs intermittently.  Associated symptoms include irritable, restlessness and sad.  Associated symptoms include no helplessness and no hopelessness.  Past treatments include SSRIs - Selective serotonin reuptake inhibitors.  Past medical history includes anxiety.   Nicotine Dependence Presents for follow-up visit. Symptoms include irritability. His urge triggers include company of smokers. The symptoms have been stable. He smokes 1 pack of cigarettes per day.     Review of Systems  Constitutional:  Positive for irritability. Negative for malaise/fatigue.  Respiratory:  Negative for shortness of breath.    Psychiatric/Behavioral:  Positive for depression. The patient is nervous/anxious.   All other systems reviewed and are negative.     Objective:   Physical Exam Vitals reviewed.  Constitutional:      General: He is irritable. He is not in acute distress.    Appearance: He is well-developed. He is obese.  HENT:     Head: Normocephalic.     Right Ear: Tympanic membrane normal.     Left Ear: Tympanic membrane normal.  Eyes:     General:        Right eye: No discharge.        Left eye: No discharge.     Pupils: Pupils are equal, round, and reactive to light.  Neck:     Thyroid: No thyromegaly.  Cardiovascular:     Rate and Rhythm: Normal rate and regular rhythm.     Heart sounds: Normal heart sounds. No murmur heard. Pulmonary:     Effort: Pulmonary effort is normal. No respiratory distress.     Breath sounds: Normal breath sounds. No wheezing.  Abdominal:     General: Bowel sounds are normal. There is no distension.     Palpations: Abdomen is soft.     Tenderness: There is no abdominal tenderness.  Musculoskeletal:        General: No tenderness. Normal range of motion.     Cervical back: Normal range of motion and neck supple.  Skin:    General: Skin is warm and dry.     Findings: Erythema present. No rash.     Comments: Erythemas, patchy rash on bilateral arms, legs, elbows  Neurological:     Mental Status:  He is alert and oriented to person, place, and time.     Cranial Nerves: No cranial nerve deficit.     Deep Tendon Reflexes: Reflexes are normal and symmetric.  Psychiatric:        Behavior: Behavior normal.        Thought Content: Thought content normal.        Judgment: Judgment normal.      BP 132/88   Pulse 99   Temp 97.8 F (36.6 C) (Temporal)   Wt 243 lb (110.2 kg)   BMI 32.96 kg/m      Assessment & Plan:  [name] comes in today with chief complaint of Medical Management of Chronic Issues (WANTS AN RX FOR TOOTH PAIN [facility])   Diagnosis and orders addressed:  1. Essential hypertension - lisinopril (ZESTRIL) 20 MG tablet; Take 1 tablet (20 mg total) by mouth daily.  Dispense: 90 tablet; Refill: 3 - CMP14+EGFR  2. Benzodiazepine dependence (HCC) - ALPRAZolam (XANAX) 0.5 MG tablet; Take 1 tablet (0.5 mg total) by mouth 2 (two) times daily as needed for anxiety.  Dispense: 60 tablet; Refill: 5 - CMP14+EGFR  3. Controlled substance agreement signed - ALPRAZolam (XANAX) 0.5 MG tablet; Take 1 tablet (0.5 mg total) by mouth 2 (two) times daily as needed for anxiety.  Dispense: 60 tablet; Refill: 5 - CMP14+EGFR - ToxASSURE Select 13 (MW), Urine  4. GAD (generalized anxiety disorder) - ALPRAZolam (XANAX) 0.5 MG tablet; Take 1 tablet (0.5 mg total) by mouth 2 (two) times daily as needed for anxiety.  Dispense: 60 tablet; Refill: 5 - CMP14+EGFR - ToxASSURE Select 13 (MW), Urine  5. Psoriasis - CMP14+EGFR  6. Current smoker - CMP14+EGFR  7. Severe episode of recurrent major depressive disorder, without psychotic features ([place]) - CMP14+EGFR  8. Obesity (BMI 30-39.9) - CMP14+EGFR  9. Abscessed tooth Start Augmentin  - amoxicillin-clavulanate (AUGMENTIN) 875-125 MG tablet; Take 1 tablet by mouth 2 (two) times daily.  Dispense: 14 tablet; Refill: 0   Labs pending Patient reviewed in [place] controlled database, no flags noted. Contract and drug screen up dated  Health Maintenance reviewed Diet and exercise encouraged  Follow up plan: 6 months    Evelina Dun, FNP

## 2021-06-11 NOTE — Patient Instructions (Signed)

## 2021-06-12 LAB — CMP14+EGFR
ALT: 33 IU/L (ref 0–44)
AST: 53 IU/L — ABNORMAL HIGH (ref 0–40)
Albumin/Globulin Ratio: 2 (ref 1.2–2.2)
Albumin: 4.6 g/dL (ref 4.0–5.0)
Alkaline Phosphatase: 69 IU/L (ref 44–121)
BUN/Creatinine Ratio: 9 (ref 9–20)
BUN: 7 mg/dL (ref 6–20)
Bilirubin Total: 0.3 mg/dL (ref 0.0–1.2)
CO2: 26 mmol/L (ref 20–29)
Calcium: 9.9 mg/dL (ref 8.7–10.2)
Chloride: 99 mmol/L (ref 96–106)
Creatinine, Ser: 0.75 mg/dL — ABNORMAL LOW (ref 0.76–1.27)
Globulin, Total: 2.3 g/dL (ref 1.5–4.5)
Glucose: 89 mg/dL (ref 65–99)
Potassium: 4.6 mmol/L (ref 3.5–5.2)
Sodium: 142 mmol/L (ref 134–144)
Total Protein: 6.9 g/dL (ref 6.0–8.5)
eGFR: 120 mL/min/{1.73_m2} (ref >59)

## 2021-06-14 LAB — TOXASSURE SELECT 13 (MW), URINE

## 2021-06-17 ENCOUNTER — Encounter: Payer: Self-pay | Admitting: Family Medicine

## 2021-09-29 ENCOUNTER — Other Ambulatory Visit: Payer: Self-pay | Admitting: Family

## 2021-09-29 DIAGNOSIS — F332 Major depressive disorder, recurrent severe without psychotic features: Secondary | ICD-10-CM

## 2021-09-29 DIAGNOSIS — F411 Generalized anxiety disorder: Secondary | ICD-10-CM

## 2021-12-12 ENCOUNTER — Ambulatory Visit (INDEPENDENT_AMBULATORY_CARE_PROVIDER_SITE_OTHER): Payer: Self-pay | Admitting: Family

## 2021-12-12 ENCOUNTER — Encounter: Payer: Self-pay | Admitting: Family

## 2021-12-12 VITALS — BP 140/80 | HR 97 | Temp 98.8°F | Ht 72.0 in | Wt 238.8 lb

## 2021-12-12 DIAGNOSIS — F411 Generalized anxiety disorder: Secondary | ICD-10-CM

## 2021-12-12 DIAGNOSIS — I10 Essential (primary) hypertension: Secondary | ICD-10-CM

## 2021-12-12 DIAGNOSIS — F332 Major depressive disorder, recurrent severe without psychotic features: Secondary | ICD-10-CM

## 2021-12-12 DIAGNOSIS — L409 Psoriasis, unspecified: Secondary | ICD-10-CM

## 2021-12-12 DIAGNOSIS — F1320 Sedative, hypnotic or anxiolytic dependence, uncomplicated: Secondary | ICD-10-CM

## 2021-12-12 DIAGNOSIS — Z79899 Other long term (current) drug therapy: Secondary | ICD-10-CM

## 2021-12-12 DIAGNOSIS — F17200 Nicotine dependence, unspecified, uncomplicated: Secondary | ICD-10-CM

## 2021-12-12 DIAGNOSIS — E669 Obesity, unspecified: Secondary | ICD-10-CM

## 2021-12-12 MED ORDER — ALPRAZOLAM 0.5 MG PO TABS: 0.5000 mg | ORAL_TABLET | Freq: Two times a day (BID) | ORAL | 5 refills | Status: DC | PRN

## 2021-12-12 MED ORDER — LISINOPRIL 20 MG PO TABS: 20.0000 mg | ORAL_TABLET | Freq: Every day | ORAL | 3 refills | Status: DC

## 2021-12-12 MED ORDER — PAROXETINE HCL 40 MG PO TABS: 40.0000 mg | ORAL_TABLET | Freq: Every day | ORAL | 1 refills | Status: DC

## 2021-12-12 NOTE — Progress Notes (Signed)
Subjective:    Patient ID: Jeffery Peters, male    DOB: Mar 20, 1986, 36 y.o.   MRN: [id number]  Chief Complaint  Patient presents with   Medical Management of Chronic Issues   Pt presents to the office today for chronic follow up. Pt has psoriasis and is followed by Dermatologists annually. States he has flare ups.  Hypertension This is a chronic problem. The current episode started more than 1 year ago. The problem has been waxing and waning since onset. The problem is uncontrolled. Associated symptoms include anxiety. Pertinent negatives include no malaise/fatigue, peripheral edema or shortness of breath. Risk factors for coronary artery disease include male gender and obesity. Past treatments include ACE inhibitors. The current treatment provides moderate improvement.  Anxiety Presents for follow-up visit. Symptoms include excessive worry, irritability and nervous/anxious behavior. Patient reports no restlessness or shortness of breath. Symptoms occur occasionally. The severity of symptoms is moderate.    Depression        This is a chronic problem.  The current episode started more than 1 year ago.   The onset quality is gradual.   Associated symptoms include no helplessness, no hopelessness, not irritable, no restlessness and not sad.  Past treatments include SSRIs - Selective serotonin reuptake inhibitors.  Past medical history includes anxiety.   Nicotine Dependence Presents for follow-up visit. Symptoms include irritability. His urge triggers include company of smokers. The symptoms have been stable. He smokes 1 pack of cigarettes per day.     Review of Systems  Constitutional:  Positive for irritability. Negative for malaise/fatigue.  Respiratory:  Negative for shortness of breath.   Psychiatric/Behavioral:  Positive for depression. The patient is nervous/anxious.   All other systems reviewed and are negative.     Objective:   Physical Exam Vitals reviewed.  Constitutional:       General: He is not irritable.He is not in acute distress.    Appearance: He is well-developed.  HENT:     Head: Normocephalic.     Right Ear: Tympanic membrane normal.     Left Ear: Tympanic membrane normal.  Eyes:     General:        Right eye: No discharge.        Left eye: No discharge.     Pupils: Pupils are equal, round, and reactive to light.  Neck:     Thyroid: No thyromegaly.  Cardiovascular:     Rate and Rhythm: Normal rate and regular rhythm.     Heart sounds: Normal heart sounds. No murmur heard. Pulmonary:     Effort: Pulmonary effort is normal. No respiratory distress.     Breath sounds: Normal breath sounds. No wheezing.  Abdominal:     General: Bowel sounds are normal. There is no distension.     Palpations: Abdomen is soft.     Tenderness: There is no abdominal tenderness.  Musculoskeletal:        General: No tenderness. Normal range of motion.     Cervical back: Normal range of motion and neck supple.  Skin:    General: Skin is warm and dry.     Findings: Rash present. No erythema.       Neurological:     Mental Status: He is alert and oriented to person, place, and time.     Cranial Nerves: No cranial nerve deficit.     Deep Tendon Reflexes: Reflexes are normal and symmetric.  Psychiatric:        Behavior: Behavior  normal.        Thought Content: Thought content normal.        Judgment: Judgment normal.      BP 140/80    Pulse 97    Temp 98.8 F (37.1 C) (Temporal)    Ht 6' (1.829 m)    Wt 238 lb 12.8 oz (108.3 kg)    BMI 32.39 kg/m      Assessment & Plan:  Caryl A Kallman comes in today with chief complaint of Medical Management of Chronic Issues   Diagnosis and orders addressed:  1. Essential hypertension - lisinopril (ZESTRIL) 20 MG tablet; Take 1 tablet (20 mg total) by mouth daily.  Dispense: 90 tablet; Refill: 3  2. Psoriasis  3. Benzodiazepine dependence (HCC) - ALPRAZolam (XANAX) 0.5 MG tablet; Take 1 tablet (0.5 mg total) by  mouth 2 (two) times daily as needed for anxiety.  Dispense: 60 tablet; Refill: 5  4. Controlled substance agreement signed - ALPRAZolam (XANAX) 0.5 MG tablet; Take 1 tablet (0.5 mg total) by mouth 2 (two) times daily as needed for anxiety.  Dispense: 60 tablet; Refill: 5  5. Current smoker  6. Severe episode of recurrent major depressive disorder, without psychotic features ([place]) - PARoxetine (PAXIL) 40 MG tablet; Take 1 tablet (40 mg total) by mouth daily.  Dispense: 90 tablet; Refill: 1  7. GAD (generalized anxiety disorder) - ALPRAZolam (XANAX) 0.5 MG tablet; Take 1 tablet (0.5 mg total) by mouth 2 (two) times daily as needed for anxiety.  Dispense: 60 tablet; Refill: 5 - PARoxetine (PAXIL) 40 MG tablet; Take 1 tablet (40 mg total) by mouth daily.  Dispense: 90 tablet; Refill: 1  8. Obesity (BMI 30-39.9)   Patient reviewed in [place] controlled database, no flags noted. Contract and drug screen are up to date.  Health Maintenance reviewed Diet and exercise encouraged  Follow up plan: 6 months    Evelina Dun, FNP

## 2021-12-12 NOTE — Patient Instructions (Signed)
Psoriasis Psoriasis is a long-term (chronic) skin condition. It occurs because your immune system causes skin cells to form too quickly. As a result, too many skin cells grow and create raised, red patches (plaques) that often look silvery on your skin. Plaques may show up anywhere on your body. They can be any size or shape. Symptoms of this condition range from mildto very severe. Psoriasis cannot be passed from one person to another (is not contagious). Sometimes, the symptoms go away and then come back again. What are the causes? The cause of psoriasis is not known, but certain factors can make the condition worse. These include: Damage or trauma to the skin, such as cuts, scrapes, sunburn, and dryness. Not enough exposure to sunlight. Certain medicines. Alcohol. Tobacco use. Stress. Infections caused by bacteria or viruses. What increases the risk? You are more likely to develop this condition if you: Have a family history of psoriasis. Are obese. Are 20-40 years old. Are taking certain medicines. What are the signs or symptoms? There are different types of psoriasis. You can have more than one type of psoriasis during your life. The types are: Plaque. This is the most common. Guttate. This is also called eruptive psoriasis. Inverse. Pustular. Erythrodermic. Sebopsoriasis. Psoriatic arthritis. Each type of psoriasis has different symptoms. Plaque psoriasis symptoms include red, raised plaques with a silvery-white coating (scale). These plaques may be itchy. Your nails may be pitted and crumbly or fall off. Guttate psoriasis symptoms include small red spots that often show up on your trunk, arms, and legs. These spots may develop after you have been sick, especially with strep throat. Inverse psoriasis symptoms include plaques in your underarm area, under your breasts, or on your genitals, groin, or buttocks. Pustular psoriasis symptoms include pus-filled bumps that are painful,  red, and swollen on the palms of your hands or the soles of your feet. You also may feel exhausted, feverish, weak, or have no appetite. Erythrodermic psoriasis symptoms include bright red skin that may look burned. You may have a fast heartbeat and a body temperature that is too high or too low. You may be itchy or in pain. Sebopsoriasis symptoms include red plaques that have a greasy coating, and are often on your scalp, forehead, and face. Psoriatic arthritis causes swollen, painful joints along with scaly skin plaques. How is this diagnosed? This condition is diagnosed based on your symptoms, family history, and a physical exam. You may also be referred to a health care provider who specializes in skin diseases (dermatologist). Your health care provider may remove a tissue sample (biopsy) for testing. How is this treated? There is no cure for this condition, but treatment can help manage it. Goals of treatment include: Helping your skin heal. Reducing itching and inflammation. Slowing the growth of new skin cells. Helping your immune system respond better to your skin. Treatment varies, depending on the severity of your condition. This condition may be treated by: Creams or ointments to help with symptoms. Ultraviolet ray exposure (light therapy or phototherapy). This may include natural sunlight or light therapy in a medical office. Medicines (systemic therapy). These medicines can help your body better manage skin cell turnover and inflammation. Medicines may be given in the form of pills or injections. They may be used along with light therapy or ointments. You may also get antibiotic medicines if you have an infection. Follow these instructions at home: Skin Care Moisturize your skin as needed. Only use moisturizers that have been approved by your   health care provider. Apply cool, wet cloths (cold compresses) to the affected areas. Do not use a hot tub or take hot showers. Take lukewarm  showers and baths. Do not scratch your skin. Lifestyle  Do not use any products that contain nicotine or tobacco, such as cigarettes, e-cigarettes, and chewing tobacco. If you need help quitting, ask your health care provider. Use techniques for stress reduction, such as meditation or yoga. Maintain a healthy weight. Follow instructions from your health care provider for weight control. These may include dietary restrictions. Get safe exposure to the sun as told by your health care provider. This may include spending regular intervals of time outdoors in sunlight. Do not get sunburned. Consider joining a psoriasis support group.  Medicines Take or use over-the-counter and prescription medicines only as told by your health care provider. If you were prescribed an antibiotic medicine, take it as told by your health care provider. Do not stop using the antibiotic even if you start to feel better. Alcohol use Limit how much you use: 0-1 drink a day for women. 0-2 drinks a day for men. Be aware of how much alcohol is in your drink. In the [place] one drink equals one 12 oz bottle of beer (355 mL), one 5 oz glass of wine (148 mL), or one 1 oz glass of hard liquor (44 mL). General instructions Keep a journal to help track what triggers an outbreak. Try to avoid any triggers. See a counselor if feelings of sadness, frustration, and hopelessness about your condition are interfering with your work and relationships. Keep all follow-up visits as told by your health care provider. This is important. Contact a health care provider if: You have a fever. Your pain gets worse. You have increasing redness or warmth in the affected areas. You have new or worsening pain or stiffness in your joints. Your nails start to break easily or pull away from the nail bed. You feel depressed. Summary Psoriasis is a long-term (chronic) skin condition. Patches (plaques) may show up anywhere on your body. There is no  cure for this condition, but treatment can help manage it. Treatment varies, depending on the severity of your condition. Keep a journal to track what triggers an outbreak. Try to avoid any triggers. Take or use over-the-counter and prescription medicines only as told by your health care provider. Keep all follow-up visits as told by your health care provider. This is important. This information is not intended to replace advice given to you by your health care provider. Make sure you discuss any questions you have with your healthcare provider. Document Revised: 08/17/2018 Document Reviewed: 08/17/2018 Elsevier Patient Education  2022 [employer]  

## 2022-01-21 ENCOUNTER — Ambulatory Visit (INDEPENDENT_AMBULATORY_CARE_PROVIDER_SITE_OTHER): Payer: Self-pay | Admitting: *Deleted

## 2022-01-21 DIAGNOSIS — Z111 Encounter for screening for respiratory tuberculosis: Secondary | ICD-10-CM

## 2022-01-23 LAB — TB SKIN TEST
Induration: 0 mm
TB Skin Test: NEGATIVE

## 2022-05-26 ENCOUNTER — Other Ambulatory Visit: Payer: Self-pay | Admitting: Family

## 2022-05-26 DIAGNOSIS — F332 Major depressive disorder, recurrent severe without psychotic features: Secondary | ICD-10-CM

## 2022-05-26 DIAGNOSIS — F411 Generalized anxiety disorder: Secondary | ICD-10-CM

## 2022-06-10 ENCOUNTER — Ambulatory Visit (INDEPENDENT_AMBULATORY_CARE_PROVIDER_SITE_OTHER): Payer: Self-pay | Admitting: Family

## 2022-06-10 ENCOUNTER — Encounter: Payer: Self-pay | Admitting: Family

## 2022-06-10 VITALS — BP 132/88 | HR 112 | Temp 98.3°F | Ht 72.0 in | Wt 232.8 lb

## 2022-06-10 DIAGNOSIS — F332 Major depressive disorder, recurrent severe without psychotic features: Secondary | ICD-10-CM

## 2022-06-10 DIAGNOSIS — Z79899 Other long term (current) drug therapy: Secondary | ICD-10-CM

## 2022-06-10 DIAGNOSIS — K2100 Gastro-esophageal reflux disease with esophagitis, without bleeding: Secondary | ICD-10-CM

## 2022-06-10 DIAGNOSIS — L409 Psoriasis, unspecified: Secondary | ICD-10-CM

## 2022-06-10 DIAGNOSIS — F411 Generalized anxiety disorder: Secondary | ICD-10-CM

## 2022-06-10 DIAGNOSIS — F17200 Nicotine dependence, unspecified, uncomplicated: Secondary | ICD-10-CM

## 2022-06-10 DIAGNOSIS — F1320 Sedative, hypnotic or anxiolytic dependence, uncomplicated: Secondary | ICD-10-CM

## 2022-06-10 DIAGNOSIS — I10 Essential (primary) hypertension: Secondary | ICD-10-CM

## 2022-06-10 DIAGNOSIS — E669 Obesity, unspecified: Secondary | ICD-10-CM

## 2022-06-10 MED ORDER — OMEPRAZOLE 20 MG PO CPDR: 20.0000 mg | DELAYED_RELEASE_CAPSULE | Freq: Every day | ORAL | 1 refills | Status: DC

## 2022-06-10 MED ORDER — ALPRAZOLAM 0.5 MG PO TABS: 0.5000 mg | ORAL_TABLET | Freq: Two times a day (BID) | ORAL | 5 refills | Status: DC | PRN

## 2022-06-10 NOTE — Patient Instructions (Signed)
Gastroesophageal Reflux Disease, Adult Gastroesophageal reflux (GER) happens when acid from the stomach flows up into the tube that connects the mouth and the stomach (esophagus). Normally, food travels down the esophagus and stays in the stomach to be digested. However, when a person has GER, food and stomach acid sometimes move back up into the esophagus. If this becomes a more serious problem, the person may be diagnosed with a disease called gastroesophageal reflux disease (GERD). GERD occurs when the reflux: Happens often. Causes frequent or severe symptoms. Causes problems such as damage to the esophagus. When stomach acid comes in contact with the esophagus, the acid may cause inflammation in the esophagus. Over time, GERD may create small holes (ulcers) in the lining of the esophagus. What are the causes? This condition is caused by a problem with the muscle between the esophagus and the stomach (lower esophageal sphincter, or LES). Normally, the LES muscle closes after food passes through the esophagus to the stomach. When the LES is weakened or abnormal, it does not close properly, and that allows food and stomach acid to go back up into the esophagus. The LES can be weakened by certain dietary substances, medicines, and medical conditions, including: Tobacco use. Pregnancy. Having a hiatal hernia. Alcohol use. Certain foods and beverages, such as coffee, chocolate, onions, and peppermint. What increases the risk? You are more likely to develop this condition if you: Have an increased body weight. Have a connective tissue disorder. Take NSAIDs, such as ibuprofen. What are the signs or symptoms? Symptoms of this condition include: Heartburn. Difficult or painful swallowing and the feeling of having a lump in the throat. A bitter taste in the mouth. Bad breath and having a large amount of saliva. Having an upset or bloated stomach and belching. Chest pain. Different conditions can  cause chest pain. Make sure you see your health care provider if you experience chest pain. Shortness of breath or wheezing. Ongoing (chronic) cough or a nighttime cough. Wearing away of tooth enamel. Weight loss. How is this diagnosed? This condition may be diagnosed based on a medical history and a physical exam. To determine if you have mild or severe GERD, your health care provider may also monitor how you respond to treatment. You may also have tests, including: A test to examine your stomach and esophagus with a small camera (endoscopy). A test that measures the acidity level in your esophagus. A test that measures how much pressure is on your esophagus. A barium swallow or modified barium swallow test to show the shape, size, and functioning of your esophagus. How is this treated? Treatment for this condition may vary depending on how severe your symptoms are. Your health care provider may recommend: Changes to your diet. Medicine. Surgery. The goal of treatment is to help relieve your symptoms and to prevent complications. Follow these instructions at home: Eating and drinking  Follow a diet as recommended by your health care provider. This may involve avoiding foods and drinks such as: Coffee and tea, with or without caffeine. Drinks that contain alcohol. Energy drinks and sports drinks. Carbonated drinks or sodas. Chocolate and cocoa. Peppermint and mint flavorings. Garlic and onions. Horseradish. Spicy and acidic foods, including peppers, chili powder, curry powder, vinegar, hot sauces, and barbecue sauce. Citrus fruit juices and citrus fruits, such as oranges, lemons, and limes. Tomato-based foods, such as red sauce, chili, salsa, and pizza with red sauce. Fried and fatty foods, such as donuts, french fries, potato chips, and high-fat dressings.   High-fat meats, such as hot dogs and fatty cuts of red and white meats, such as rib eye steak, sausage, ham, and  bacon. High-fat dairy items, such as whole milk, butter, and cream cheese. Eat small, frequent meals instead of large meals. Avoid drinking large amounts of liquid with your meals. Avoid eating meals during the 2-3 hours before bedtime. Avoid lying down right after you eat. Do not exercise right after you eat. Lifestyle  Do not use any products that contain nicotine or tobacco. These products include cigarettes, chewing tobacco, and vaping devices, such as e-cigarettes. If you need help quitting, ask your health care provider. Try to reduce your stress by using methods such as yoga or meditation. If you need help reducing stress, ask your health care provider. If you are overweight, reduce your weight to an amount that is healthy for you. Ask your health care provider for guidance about a safe weight loss goal. General instructions Pay attention to any changes in your symptoms. Take over-the-counter and prescription medicines only as told by your health care provider. Do not take aspirin, ibuprofen, or other NSAIDs unless your health care provider told you to take these medicines. Wear loose-fitting clothing. Do not wear anything tight around your waist that causes pressure on your abdomen. Raise (elevate) the head of your bed about 6 inches (15 cm). You can use a wedge to do this. Avoid bending over if this makes your symptoms worse. Keep all follow-up visits. This is important. Contact a health care provider if: You have: New symptoms. Unexplained weight loss. Difficulty swallowing or it hurts to swallow. Wheezing or a persistent cough. A hoarse voice. Your symptoms do not improve with treatment. Get help right away if: You have sudden pain in your arms, neck, jaw, teeth, or back. You suddenly feel sweaty, dizzy, or light-headed. You have chest pain or shortness of breath. You vomit and the vomit is green, yellow, or black, or it looks like blood or coffee grounds. You faint. You  have stool that is red, bloody, or black. You cannot swallow, drink, or eat. These symptoms may represent a serious problem that is an emergency. Do not wait to see if the symptoms will go away. Get medical help right away. Call your local emergency services (911 in the [place] Do not drive yourself to the hospital. Summary Gastroesophageal reflux happens when acid from the stomach flows up into the esophagus. GERD is a disease in which the reflux happens often, causes frequent or severe symptoms, or causes problems such as damage to the esophagus. Treatment for this condition may vary depending on how severe your symptoms are. Your health care provider may recommend diet and lifestyle changes, medicine, or surgery. Contact a health care provider if you have new or worsening symptoms. Take over-the-counter and prescription medicines only as told by your health care provider. Do not take aspirin, ibuprofen, or other NSAIDs unless your health care provider told you to do so. Keep all follow-up visits as told by your health care provider. This is important. This information is not intended to replace advice given to you by your health care provider. Make sure you discuss any questions you have with your health care provider. Document Revised: 04/23/2020 Document Reviewed: 04/23/2020 Elsevier Patient Education  2023 Else[employer] Inc[employer]  

## 2022-06-10 NOTE — Progress Notes (Signed)
Subjective:    Patient ID: Jeffery Peters, male    DOB: 06-Feb-1986, 36 y.o.   MRN: [id number]  Chief Complaint  Patient presents with   Medical Management of Chronic Issues   Pt presents to the office today for chronic follow up. Pt has psoriasis and is followed by Dermatologists annually. States he has flare ups.  Hypertension This is a chronic problem. The current episode started more than 1 year ago. The problem has been resolved since onset. The problem is controlled. Associated symptoms include anxiety. Pertinent negatives include no malaise/fatigue, peripheral edema or shortness of breath. Risk factors for coronary artery disease include dyslipidemia, obesity and sedentary lifestyle. The current treatment provides moderate improvement.  Anxiety Presents for follow-up visit. Symptoms include depressed mood, excessive worry, irritability, nausea and nervous/anxious behavior. Patient reports no shortness of breath. Symptoms occur constantly. The severity of symptoms is moderate.    Depression        This is a chronic problem.  The current episode started more than 1 year ago.   The problem occurs intermittently.  Associated symptoms include sad.  Associated symptoms include no helplessness and no hopelessness.  Past medical history includes anxiety.   Nicotine Dependence Presents for follow-up visit. Symptoms include irritability. His urge triggers include company of smokers. The symptoms have been stable. He smokes 1 pack of cigarettes per day.  Gastroesophageal Reflux He complains of nausea. He reports no belching or no heartburn. The current episode started more than 1 month ago. The problem occurs occasionally. He has tried an antacid for the symptoms. The treatment provided mild relief.      Review of Systems  Constitutional:  Positive for irritability. Negative for malaise/fatigue.  Respiratory:  Negative for shortness of breath.   Gastrointestinal:  Positive for nausea.  Negative for heartburn.  Psychiatric/Behavioral:  Positive for depression. The patient is nervous/anxious.   All other systems reviewed and are negative.      Objective:   Physical Exam Vitals reviewed.  Constitutional:      General: He is not in acute distress.    Appearance: He is well-developed. He is obese.  HENT:     Head: Normocephalic.     Right Ear: Tympanic membrane normal.     Left Ear: Tympanic membrane normal.  Eyes:     General:        Right eye: No discharge.        Left eye: No discharge.     Pupils: Pupils are equal, round, and reactive to light.  Neck:     Thyroid: No thyromegaly.  Cardiovascular:     Rate and Rhythm: Normal rate and regular rhythm.     Heart sounds: Normal heart sounds. No murmur heard. Pulmonary:     Effort: Pulmonary effort is normal. No respiratory distress.     Breath sounds: Normal breath sounds. No wheezing.  Abdominal:     General: Bowel sounds are normal. There is no distension.     Palpations: Abdomen is soft.     Tenderness: There is no abdominal tenderness.  Musculoskeletal:        General: No tenderness. Normal range of motion.     Cervical back: Normal range of motion and neck supple.  Skin:    General: Skin is warm and dry.     Findings: Erythema and rash (neck and bilateral arms) present.  Neurological:     Mental Status: He is alert and oriented to person, place, and time.  Cranial Nerves: No cranial nerve deficit.     Deep Tendon Reflexes: Reflexes are normal and symmetric.  Psychiatric:        Behavior: Behavior normal.        Thought Content: Thought content normal.        Judgment: Judgment normal.       BP 132/88   Pulse (!) 112   Temp 98.3 F (36.8 C) (Temporal)   Ht 6' (1.829 m)   Wt 232 lb 12.8 oz (105.6 kg)   SpO2 97%   BMI 31.57 kg/m      Assessment & Plan:  Jeffery Peters comes in today with chief complaint of Medical Management of Chronic Issues   Diagnosis and orders addressed:  1.  Benzodiazepine dependence (HCC) - ALPRAZolam (XANAX) 0.5 MG tablet; Take 1 tablet (0.5 mg total) by mouth 2 (two) times daily as needed for anxiety.  Dispense: 60 tablet; Refill: 5 - ToxASSURE Select 13 (MW), Urine - CMP14+EGFR  2. Controlled substance agreement signed - ALPRAZolam (XANAX) 0.5 MG tablet; Take 1 tablet (0.5 mg total) by mouth 2 (two) times daily as needed for anxiety.  Dispense: 60 tablet; Refill: 5 - ToxASSURE Select 13 (MW), Urine - CMP14+EGFR  3. GAD (generalized anxiety disorder) - ALPRAZolam (XANAX) 0.5 MG tablet; Take 1 tablet (0.5 mg total) by mouth 2 (two) times daily as needed for anxiety.  Dispense: 60 tablet; Refill: 5 - ToxASSURE Select 13 (MW), Urine - CMP14+EGFR  4. Obesity (BMI 30-39.9) - CMP14+EGFR  5. Severe episode of recurrent major depressive disorder, without psychotic features ([place]) - CMP14+EGFR  6. Current smoker - CMP14+EGFR  7. Psoriasis - CMP14+EGFR  8. Essential hypertension - CMP14+EGFR  9. Gastroesophageal reflux disease with esophagitis, unspecified whether hemorrhage - CMP14+EGFR --Diet discussed- Avoid fried, spicy, citrus foods, caffeine and alcohol -Do not eat 2-3 hours before bedtime -Encouraged small frequent meals -Avoid NSAID's -start Prilosec 20 mg   Labs pending Patient reviewed in [place] controlled database, no flags noted. Contract and drug screen up dated today Health Maintenance reviewed Diet and exercise encouraged  Follow up plan: 6 months    Evelina Dun, FNP

## 2022-06-12 LAB — TOXASSURE SELECT 13 (MW), URINE

## 2022-07-09 ENCOUNTER — Other Ambulatory Visit: Payer: Self-pay | Admitting: Family

## 2022-07-09 DIAGNOSIS — F1320 Sedative, hypnotic or anxiolytic dependence, uncomplicated: Secondary | ICD-10-CM

## 2022-07-09 DIAGNOSIS — F411 Generalized anxiety disorder: Secondary | ICD-10-CM

## 2022-07-09 DIAGNOSIS — Z79899 Other long term (current) drug therapy: Secondary | ICD-10-CM

## 2022-09-22 ENCOUNTER — Other Ambulatory Visit: Payer: Self-pay | Admitting: Family

## 2022-09-22 DIAGNOSIS — F411 Generalized anxiety disorder: Secondary | ICD-10-CM

## 2022-09-22 DIAGNOSIS — F332 Major depressive disorder, recurrent severe without psychotic features: Secondary | ICD-10-CM

## 2022-10-31 ENCOUNTER — Other Ambulatory Visit: Payer: Self-pay | Admitting: Family

## 2022-11-14 ENCOUNTER — Ambulatory Visit (INDEPENDENT_AMBULATORY_CARE_PROVIDER_SITE_OTHER): Payer: Self-pay | Admitting: Family

## 2022-11-14 ENCOUNTER — Encounter: Payer: Self-pay | Admitting: Family

## 2022-11-14 VITALS — BP 133/90 | HR 103 | Temp 98.4°F | Ht 72.0 in | Wt 234.8 lb

## 2022-11-14 DIAGNOSIS — F411 Generalized anxiety disorder: Secondary | ICD-10-CM

## 2022-11-14 DIAGNOSIS — F410 Panic disorder [episodic paroxysmal anxiety] without agoraphobia: Secondary | ICD-10-CM

## 2022-11-14 MED ORDER — HYDROXYZINE PAMOATE 25 MG PO CAPS: 25.0000 mg | ORAL_CAPSULE | Freq: Three times a day (TID) | ORAL | 3 refills | Status: DC | PRN

## 2022-11-14 NOTE — Progress Notes (Signed)
Subjective:    Patient ID: Jeffery Peters, male    DOB: 1986/10/27, 37 y.o.   MRN: [id number]  Chief Complaint  Patient presents with   Anxiety   Pt presents to the office today with anxiety. Reports Wednesday he had a panic attack and had to call out of work. States he needs a work note.  Anxiety Presents for follow-up visit. Symptoms include depressed mood, excessive worry, irritability, nervous/anxious behavior, palpitations, panic and restlessness. Symptoms occur most days. The severity of symptoms is moderate.        Review of Systems  Constitutional:  Positive for irritability.  Cardiovascular:  Positive for palpitations.  Psychiatric/Behavioral:  The patient is nervous/anxious.   All other systems reviewed and are negative.      Objective:   Physical Exam Vitals reviewed.  Constitutional:      General: He is not in acute distress.    Appearance: He is well-developed.  HENT:     Head: Normocephalic.     Right Ear: Tympanic membrane normal.     Left Ear: Tympanic membrane normal.  Eyes:     General:        Right eye: No discharge.        Left eye: No discharge.     Pupils: Pupils are equal, round, and reactive to light.  Neck:     Thyroid: No thyromegaly.  Cardiovascular:     Rate and Rhythm: Normal rate and regular rhythm.     Heart sounds: Normal heart sounds. No murmur heard. Pulmonary:     Effort: Pulmonary effort is normal. No respiratory distress.     Breath sounds: Normal breath sounds. No wheezing.  Abdominal:     General: Bowel sounds are normal. There is no distension.     Palpations: Abdomen is soft.     Tenderness: There is no abdominal tenderness.  Musculoskeletal:        General: No tenderness. Normal range of motion.     Cervical back: Normal range of motion and neck supple.  Skin:    General: Skin is warm and dry.     Findings: No erythema or rash.  Neurological:     Mental Status: He is alert and oriented to person, place, and time.      Cranial Nerves: No cranial nerve deficit.     Deep Tendon Reflexes: Reflexes are normal and symmetric.  Psychiatric:        Mood and Affect: Mood is anxious.        Behavior: Behavior normal.        Thought Content: Thought content normal.        Judgment: Judgment normal.       BP (!) 133/90   Pulse (!) 103   Temp 98.4 F (36.9 C) (Temporal)   Ht 6' (1.829 m)   Wt 234 lb 12.8 oz (106.5 kg)   BMI 31.84 kg/m      Assessment & Plan:  Jeffery Peters comes in today with chief complaint of Anxiety   Diagnosis and orders addressed:  1. GAD (generalized anxiety disorder) - hydrOXYzine (VISTARIL) 25 MG capsule; Take 1 capsule (25 mg total) by mouth every 8 (eight) hours as needed.  Dispense: 60 capsule; Refill: 3  2. Panic attack  - hydrOXYzine (VISTARIL) 25 MG capsule; Take 1 capsule (25 mg total) by mouth every 8 (eight) hours as needed.  Dispense: 60 capsule; Refill: 3  Continue current medications  Stress management  Work note  given  Keep chronic follow up    Evelina Dun, FNP

## 2022-11-14 NOTE — Patient Instructions (Signed)

## 2022-12-09 ENCOUNTER — Encounter: Payer: Self-pay | Admitting: Family

## 2022-12-09 ENCOUNTER — Ambulatory Visit (INDEPENDENT_AMBULATORY_CARE_PROVIDER_SITE_OTHER): Payer: Managed Care, Other (non HMO) | Admitting: Family

## 2022-12-09 VITALS — BP 120/71 | HR 101 | Temp 97.4°F | Ht 72.0 in | Wt 246.6 lb

## 2022-12-09 DIAGNOSIS — Z0001 Encounter for general adult medical examination with abnormal findings: Secondary | ICD-10-CM | POA: Diagnosis not present

## 2022-12-09 DIAGNOSIS — K219 Gastro-esophageal reflux disease without esophagitis: Secondary | ICD-10-CM

## 2022-12-09 DIAGNOSIS — Z79899 Other long term (current) drug therapy: Secondary | ICD-10-CM | POA: Diagnosis not present

## 2022-12-09 DIAGNOSIS — F1320 Sedative, hypnotic or anxiolytic dependence, uncomplicated: Secondary | ICD-10-CM | POA: Diagnosis not present

## 2022-12-09 DIAGNOSIS — F410 Panic disorder [episodic paroxysmal anxiety] without agoraphobia: Secondary | ICD-10-CM

## 2022-12-09 DIAGNOSIS — F332 Major depressive disorder, recurrent severe without psychotic features: Secondary | ICD-10-CM

## 2022-12-09 DIAGNOSIS — L409 Psoriasis, unspecified: Secondary | ICD-10-CM

## 2022-12-09 DIAGNOSIS — I10 Essential (primary) hypertension: Secondary | ICD-10-CM

## 2022-12-09 DIAGNOSIS — F411 Generalized anxiety disorder: Secondary | ICD-10-CM

## 2022-12-09 DIAGNOSIS — F17200 Nicotine dependence, unspecified, uncomplicated: Secondary | ICD-10-CM

## 2022-12-09 DIAGNOSIS — Z0000 Encounter for general adult medical examination without abnormal findings: Secondary | ICD-10-CM

## 2022-12-09 DIAGNOSIS — E669 Obesity, unspecified: Secondary | ICD-10-CM

## 2022-12-09 MED ORDER — LISINOPRIL 20 MG PO TABS: 20.0000 mg | ORAL_TABLET | Freq: Every day | ORAL | 3 refills | Status: DC

## 2022-12-09 MED ORDER — PAROXETINE HCL 40 MG PO TABS: 40.0000 mg | ORAL_TABLET | Freq: Every day | ORAL | 2 refills | Status: DC

## 2022-12-09 MED ORDER — OMEPRAZOLE 20 MG PO CPDR: 20.0000 mg | DELAYED_RELEASE_CAPSULE | Freq: Every day | ORAL | 1 refills | Status: DC

## 2022-12-09 MED ORDER — ALPRAZOLAM 0.5 MG PO TABS: 0.5000 mg | ORAL_TABLET | Freq: Two times a day (BID) | ORAL | 5 refills | Status: DC | PRN

## 2022-12-09 MED ORDER — HYDROXYZINE PAMOATE 25 MG PO CAPS: 25.0000 mg | ORAL_CAPSULE | Freq: Three times a day (TID) | ORAL | 3 refills | Status: DC | PRN

## 2022-12-09 NOTE — Progress Notes (Signed)
Subjective:    Patient ID: Jeffery Peters, male    DOB: [birth year], 37 y.o.   MRN: [id number]  Chief Complaint  Patient presents with   Medical Management of Chronic Issues   Pt presents to the office today for CPE and chronic follow up. Pt has psoriasis and is followed by Dermatologists annually. States he has flare ups.   Hypertension This is a chronic problem. The current episode started more than 1 year ago. The problem has been resolved since onset. The problem is controlled. Associated symptoms include anxiety and palpitations. Pertinent negatives include no malaise/fatigue, peripheral edema or shortness of breath. Risk factors for coronary artery disease include dyslipidemia, obesity, male gender and sedentary lifestyle. The current treatment provides moderate improvement.  Anxiety Presents for follow-up visit. Symptoms include depressed mood, excessive worry, irritability, nervous/anxious behavior, palpitations, panic and restlessness. Patient reports no shortness of breath. Symptoms occur occasionally. The severity of symptoms is moderate.    Depression        This is a chronic problem.  The current episode started more than 1 year ago.   The problem occurs intermittently.  Associated symptoms include fatigue, restlessness and sad.  Associated symptoms include no helplessness and no hopelessness.  Past treatments include SSRIs - Selective serotonin reuptake inhibitors.  Past medical history includes anxiety.   Nicotine Dependence Presents for follow-up visit. Symptoms include fatigue and irritability. His urge triggers include company of smokers. The symptoms have been stable. He smokes 1 pack of cigarettes per day.  Gastroesophageal Reflux He complains of belching, heartburn and a hoarse voice. This is a chronic problem. The problem occurs occasionally. Associated symptoms include fatigue. He has tried a PPI for the symptoms. The treatment provided moderate relief.      Review of  Systems  Constitutional:  Positive for fatigue and irritability. Negative for malaise/fatigue.  HENT:  Positive for hoarse voice.   Respiratory:  Negative for shortness of breath.   Cardiovascular:  Positive for palpitations.  Gastrointestinal:  Positive for heartburn.  Psychiatric/Behavioral:  Positive for depression. The patient is nervous/anxious.   All other systems reviewed and are negative.  Family History  Problem Relation Age of Onset   Cancer Father        lung   Social History   Socioeconomic History   Marital status: Single    Spouse name: Not on file   Number of children: Not on file   Years of education: Not on file   Highest education level: Not on file  Occupational History   Occupation: cook  Tobacco Use   Smoking status: Heavy Smoker    Packs/day: 1.50    Types: Cigarettes   Smokeless tobacco: Never  Vaping Use   Vaping Use: Never used  Substance and Sexual Activity   Alcohol use: Yes   Drug use: No   Sexual activity: Not on file  Other Topics Concern   Not on file  Social History Narrative   Lives alone.  Works [employer].     Social Determinants of Health   Financial Resource Strain: Not on file  Food Insecurity: Not on file  Transportation Needs: Not on file  Physical Activity: Not on file  Stress: Not on file  Social Connections: Not on file       Objective:   Physical Exam Vitals reviewed.  Constitutional:      General: He is not in acute distress.    Appearance: He is well-developed. He is obese.  HENT:  Head: Normocephalic.     Right Ear: Tympanic membrane normal.     Left Ear: Tympanic membrane normal.  Eyes:     General:        Right eye: No discharge.        Left eye: No discharge.     Pupils: Pupils are equal, round, and reactive to light.  Neck:     Thyroid: No thyromegaly.  Cardiovascular:     Rate and Rhythm: Normal rate and regular rhythm.     Heart sounds: Normal heart sounds. No murmur heard. Pulmonary:      Effort: Pulmonary effort is normal. No respiratory distress.     Breath sounds: Normal breath sounds. No wheezing.  Abdominal:     General: Bowel sounds are normal. There is no distension.     Palpations: Abdomen is soft.     Tenderness: There is no abdominal tenderness.  Musculoskeletal:        General: No tenderness. Normal range of motion.     Cervical back: Normal range of motion and neck supple.  Skin:    General: Skin is warm and dry.     Findings: No erythema or rash.  Neurological:     Mental Status: He is alert and oriented to person, place, and time.     Cranial Nerves: No cranial nerve deficit.     Deep Tendon Reflexes: Reflexes are normal and symmetric.  Psychiatric:        Behavior: Behavior normal.        Thought Content: Thought content normal.        Judgment: Judgment normal.       BP 120/71   Pulse (!) 101   Temp (!) 97.4 F (36.3 C) (Temporal)   Ht 6' (1.829 m)   Wt 246 lb 9.6 oz (111.9 kg)   BMI 33.44 kg/m      Assessment & Plan:   Jeffery Peters comes in today with chief complaint of Medical Management of Chronic Issues   Diagnosis and orders addressed:  1. GAD (generalized anxiety disorder) - ALPRAZolam (XANAX) 0.5 MG tablet; Take 1 tablet (0.5 mg total) by mouth 2 (two) times daily as needed for anxiety.  Dispense: 60 tablet; Refill: 5 - CMP14+EGFR - PARoxetine (PAXIL) 40 MG tablet; Take 1 tablet (40 mg total) by mouth daily.  Dispense: 90 tablet; Refill: 2 - hydrOXYzine (VISTARIL) 25 MG capsule; Take 1 capsule (25 mg total) by mouth every 8 (eight) hours as needed.  Dispense: 180 capsule; Refill: 3  2. Benzodiazepine dependence (HCC) - ALPRAZolam (XANAX) 0.5 MG tablet; Take 1 tablet (0.5 mg total) by mouth 2 (two) times daily as needed for anxiety.  Dispense: 60 tablet; Refill: 5 - CMP14+EGFR  3. Controlled substance agreement signed - ALPRAZolam (XANAX) 0.5 MG tablet; Take 1 tablet (0.5 mg total) by mouth 2 (two) times daily as needed for  anxiety.  Dispense: 60 tablet; Refill: 5 - CMP14+EGFR  4. Essential hypertension - lisinopril (ZESTRIL) 20 MG tablet; Take 1 tablet (20 mg total) by mouth daily.  Dispense: 90 tablet; Refill: 3 - CMP14+EGFR  5. Current smoker - CMP14+EGFR  6. Severe episode of recurrent major depressive disorder, without psychotic features ([place]) - CMP14+EGFR - PARoxetine (PAXIL) 40 MG tablet; Take 1 tablet (40 mg total) by mouth daily.  Dispense: 90 tablet; Refill: 2  7. Obesity (BMI 30-39.9) - CMP14+EGFR  8. Psoriasis - CMP14+EGFR  9. Annual physical exam - CMP14+EGFR - CBC with Differential/Platelet - Lipid  panel - TSH  10. Gastroesophageal reflux disease without esophagitis - omeprazole (PRILOSEC) 20 MG capsule; Take 1 capsule (20 mg total) by mouth daily.  Dispense: 90 capsule; Refill: 1 - CMP14+EGFR  11. Panic attack - hydrOXYzine (VISTARIL) 25 MG capsule; Take 1 capsule (25 mg total) by mouth every 8 (eight) hours as needed.  Dispense: 180 capsule; Refill: 3   Labs pending Patient reviewed in [place] controlled database, no flags noted. Contract and drug screen are up to date.  Health Maintenance reviewed Diet and exercise encouraged  Follow up plan: 3 months    Evelina Dun, FNP

## 2022-12-09 NOTE — Patient Instructions (Signed)

## 2022-12-10 LAB — CBC WITH DIFFERENTIAL/PLATELET
Basophils Absolute: 0.1 10*3/uL (ref 0.0–0.2)
Basos: 1 %
EOS (ABSOLUTE): 0.2 10*3/uL (ref 0.0–0.4)
Eos: 4 %
Hematocrit: 48.1 % (ref 37.5–51.0)
Hemoglobin: 15.9 g/dL (ref 13.0–17.7)
Immature Grans (Abs): 0 10*3/uL (ref 0.0–0.1)
Immature Granulocytes: 0 %
Lymphocytes Absolute: 1.1 10*3/uL (ref 0.7–3.1)
Lymphs: 27 %
MCH: 30.3 pg (ref 26.6–33.0)
MCHC: 33.1 g/dL (ref 31.5–35.7)
MCV: 92 fL (ref 79–97)
Monocytes Absolute: 0.5 10*3/uL (ref 0.1–0.9)
Monocytes: 12 %
Neutrophils Absolute: 2.3 10*3/uL (ref 1.4–7.0)
Neutrophils: 56 %
Platelets: 189 10*3/uL (ref 150–450)
RBC: 5.24 x10E6/uL (ref 4.14–5.80)
RDW: 12.5 % (ref 11.6–15.4)
WBC: 4.1 10*3/uL (ref 3.4–10.8)

## 2022-12-10 LAB — CMP14+EGFR
ALT: 27 IU/L (ref 0–44)
AST: 44 IU/L — ABNORMAL HIGH (ref 0–40)
Albumin/Globulin Ratio: 1.9 (ref 1.2–2.2)
Albumin: 4.7 g/dL (ref 4.1–5.1)
Alkaline Phosphatase: 75 IU/L (ref 44–121)
BUN/Creatinine Ratio: 29 — ABNORMAL HIGH (ref 9–20)
BUN: 20 mg/dL (ref 6–20)
Bilirubin Total: 0.4 mg/dL (ref 0.0–1.2)
CO2: 22 mmol/L (ref 20–29)
Calcium: 10.1 mg/dL (ref 8.7–10.2)
Chloride: 97 mmol/L (ref 96–106)
Creatinine, Ser: 0.69 mg/dL — ABNORMAL LOW (ref 0.76–1.27)
Globulin, Total: 2.5 g/dL (ref 1.5–4.5)
Glucose: 96 mg/dL (ref 70–99)
Potassium: 4.6 mmol/L (ref 3.5–5.2)
Sodium: 136 mmol/L (ref 134–144)
Total Protein: 7.2 g/dL (ref 6.0–8.5)
eGFR: 121 mL/min/{1.73_m2} (ref >59)

## 2022-12-10 LAB — LIPID PANEL
Chol/HDL Ratio: 4.5 ratio (ref 0.0–5.0)
Cholesterol, Total: 248 mg/dL — ABNORMAL HIGH (ref 100–199)
HDL: 55 mg/dL (ref >39)
LDL Chol Calc (NIH): 93 mg/dL (ref 0–99)
Triglycerides: 607 mg/dL (ref 0–149)
VLDL Cholesterol Cal: 100 mg/dL — ABNORMAL HIGH (ref 5–40)

## 2022-12-10 LAB — TSH: TSH: 1.06 u[IU]/mL (ref 0.450–4.500)

## 2022-12-11 ENCOUNTER — Other Ambulatory Visit: Payer: Self-pay | Admitting: Family

## 2022-12-11 MED ORDER — ROSUVASTATIN CALCIUM 10 MG PO TABS: 10.0000 mg | ORAL_TABLET | Freq: Every day | ORAL | 3 refills | Status: DC

## 2023-01-07 ENCOUNTER — Other Ambulatory Visit: Payer: Self-pay | Admitting: Family

## 2023-01-07 DIAGNOSIS — Z79899 Other long term (current) drug therapy: Secondary | ICD-10-CM

## 2023-01-07 DIAGNOSIS — F411 Generalized anxiety disorder: Secondary | ICD-10-CM

## 2023-01-07 DIAGNOSIS — F1320 Sedative, hypnotic or anxiolytic dependence, uncomplicated: Secondary | ICD-10-CM

## 2023-01-19 ENCOUNTER — Ambulatory Visit (INDEPENDENT_AMBULATORY_CARE_PROVIDER_SITE_OTHER): Payer: Managed Care, Other (non HMO) | Admitting: *Deleted

## 2023-01-19 DIAGNOSIS — Z111 Encounter for screening for respiratory tuberculosis: Secondary | ICD-10-CM | POA: Diagnosis not present

## 2023-01-19 NOTE — Progress Notes (Signed)
PPD skin test, left foream

## 2023-01-21 ENCOUNTER — Ambulatory Visit: Payer: Managed Care, Other (non HMO)

## 2023-01-21 DIAGNOSIS — Z111 Encounter for screening for respiratory tuberculosis: Secondary | ICD-10-CM

## 2023-01-21 NOTE — Progress Notes (Signed)
TB skin test negative.  Results were faxed to [facility] at [phone].

## 2023-03-10 ENCOUNTER — Telehealth (INDEPENDENT_AMBULATORY_CARE_PROVIDER_SITE_OTHER): Payer: Managed Care, Other (non HMO) | Admitting: Family

## 2023-03-10 ENCOUNTER — Encounter: Payer: Self-pay | Admitting: Family

## 2023-03-10 DIAGNOSIS — K219 Gastro-esophageal reflux disease without esophagitis: Secondary | ICD-10-CM

## 2023-03-10 DIAGNOSIS — E669 Obesity, unspecified: Secondary | ICD-10-CM

## 2023-03-10 DIAGNOSIS — F17200 Nicotine dependence, unspecified, uncomplicated: Secondary | ICD-10-CM

## 2023-03-10 DIAGNOSIS — F411 Generalized anxiety disorder: Secondary | ICD-10-CM | POA: Diagnosis not present

## 2023-03-10 DIAGNOSIS — I10 Essential (primary) hypertension: Secondary | ICD-10-CM | POA: Diagnosis not present

## 2023-03-10 DIAGNOSIS — F332 Major depressive disorder, recurrent severe without psychotic features: Secondary | ICD-10-CM

## 2023-03-10 DIAGNOSIS — Z79899 Other long term (current) drug therapy: Secondary | ICD-10-CM

## 2023-03-10 DIAGNOSIS — E785 Hyperlipidemia, unspecified: Secondary | ICD-10-CM | POA: Insufficient documentation

## 2023-03-10 DIAGNOSIS — F1320 Sedative, hypnotic or anxiolytic dependence, uncomplicated: Secondary | ICD-10-CM

## 2023-03-10 DIAGNOSIS — L409 Psoriasis, unspecified: Secondary | ICD-10-CM

## 2023-03-10 MED ORDER — OMEPRAZOLE 20 MG PO CPDR: 20.0000 mg | DELAYED_RELEASE_CAPSULE | Freq: Every day | ORAL | 1 refills | Status: DC

## 2023-03-10 MED ORDER — ALPRAZOLAM 0.5 MG PO TABS: 0.5000 mg | ORAL_TABLET | Freq: Two times a day (BID) | ORAL | 5 refills | Status: DC | PRN

## 2023-03-10 NOTE — Progress Notes (Signed)
Virtual Visit Consent   Jeffery Peters, you are scheduled for a virtual visit with a [facility] provider today. Just as with appointments in the office, your consent must be obtained to participate. Your consent will be active for this visit and any virtual visit you may have with one of our providers in the next 365 days. If you have a MyChart account, a copy of this consent can be sent to you electronically.  As this is a virtual visit, video technology does not allow for your provider to perform a traditional examination. This may limit your provider's ability to fully assess your condition. If your provider identifies any concerns that need to be evaluated in person or the need to arrange testing (such as labs, EKG, etc.), we will make arrangements to do so. Although advances in technology are sophisticated, we cannot ensure that it will always work on either your end or our end. If the connection with a video visit is poor, the visit may have to be switched to a telephone visit. With either a video or telephone visit, we are not always able to ensure that we have a secure connection.  By engaging in this virtual visit, you consent to the provision of healthcare and authorize for your insurance to be billed (if applicable) for the services provided during this visit. Depending on your insurance coverage, you may receive a charge related to this service.  I need to obtain your verbal consent now. Are you willing to proceed with your visit today? Haygen A Fader has provided verbal consent on 03/10/2023 for a virtual visit (video or telephone). Jannifer Rodney, FNP  Date: 03/10/2023 2:24 PM  Virtual Visit via Video Note   I, Jannifer Rodney, connected with  Jeffery Peters  ([id number], 10/21/1986) on 03/10/23 at  3:25 PM EDT by a video-enabled telemedicine application and verified that I am speaking with the correct person using two identifiers.  Location: Patient: Virtual Visit Location Patient:  Home Provider: Virtual Visit Location Provider: Office/Clinic   I discussed the limitations of evaluation and management by telemedicine and the availability of in person appointments. The patient expressed understanding and agreed to proceed.    History of Present Illness: Jeffery Peters is a 37 y.o. who identifies as a male who was assigned male at birth, and is being seen today for chronic follow up. Pt has psoriasis and is followed by Dermatologists annually. States he has flare ups.  Marland Kitchen  HPI: Hypertension This is a chronic problem. The current episode started more than 1 year ago. The problem has been resolved since onset. The problem is controlled. Associated symptoms include anxiety. Pertinent negatives include no malaise/fatigue, peripheral edema or shortness of breath. Risk factors for coronary artery disease include obesity and male gender. The current treatment provides moderate improvement.  Anxiety Presents for follow-up visit. Symptoms include depressed mood, excessive worry, nervous/anxious behavior and restlessness. Patient reports no shortness of breath. Symptoms occur occasionally. The severity of symptoms is moderate.    Depression        The current episode started more than 1 year ago.   The problem occurs intermittently.  Associated symptoms include restlessness.  Associated symptoms include no helplessness, no hopelessness and not sad.  Past treatments include SSRIs - Selective serotonin reuptake inhibitors.  Past medical history includes anxiety.   Nicotine Dependence Presents for follow-up visit. His urge triggers include company of smokers. The symptoms have been stable. He smokes 1 pack of cigarettes per  day.  Gastroesophageal Reflux He complains of belching and heartburn. This is a chronic problem. The current episode started more than 1 year ago. The problem occurs occasionally. He has tried a PPI for the symptoms. The treatment provided moderate relief.   Hyperlipidemia This is a chronic problem. The current episode started more than 1 year ago. Exacerbating diseases include obesity. Pertinent negatives include no shortness of breath. Current antihyperlipidemic treatment includes statins. The current treatment provides moderate improvement of lipids. Risk factors for coronary artery disease include dyslipidemia, hypertension and a sedentary lifestyle.    Problems:  Patient Active Problem List   Diagnosis Date Noted   Hyperlipemia 03/10/2023   Gastroesophageal reflux disease without esophagitis 03/10/2023   Current smoker 06/24/2019   Controlled substance agreement signed 12/24/2018   Benzodiazepine dependence (HCC) 12/24/2018   Psoriasis 10/22/2016   Obesity (BMI 30-39.9) 03/31/2016   Essential hypertension 09/14/2015   GAD (generalized anxiety disorder) 03/24/2014   Depression 03/24/2014    Allergies: No Known Allergies Medications:  Current Outpatient Medications:    rosuvastatin (CRESTOR) 10 MG tablet, Take 1 tablet (10 mg total) by mouth daily., Disp: 90 tablet, Rfl: 3   ALPRAZolam (XANAX) 0.5 MG tablet, Take 1 tablet (0.5 mg total) by mouth 2 (two) times daily as needed for anxiety., Disp: 60 tablet, Rfl: 5   hydrOXYzine (VISTARIL) 25 MG capsule, Take 1 capsule (25 mg total) by mouth every 8 (eight) hours as needed., Disp: 180 capsule, Rfl: 3   lisinopril (ZESTRIL) 20 MG tablet, Take 1 tablet (20 mg total) by mouth daily., Disp: 90 tablet, Rfl: 3   omeprazole (PRILOSEC) 20 MG capsule, Take 1 capsule (20 mg total) by mouth daily., Disp: 90 capsule, Rfl: 1   PARoxetine (PAXIL) 40 MG tablet, Take 1 tablet (40 mg total) by mouth daily., Disp: 90 tablet, Rfl: 2  Observations/Objective: Patient is well-developed, well-nourished in no acute distress.  Resting comfortably  at home.  Head is normoephalic, atraumatic.  No labored breathing.  Speech is clear and coherent with logical content.  Patient is alert and oriented at baseline.     Assessment and Plan: 1. Essential hypertension  2. GAD (generalized anxiety disorder) - ALPRAZolam (XANAX) 0.5 MG tablet; Take 1 tablet (0.5 mg total) by mouth 2 (two) times daily as needed for anxiety.  Dispense: 60 tablet; Refill: 5  3. Obesity (BMI 30-39.9)  4. Psoriasis  5. Severe episode of recurrent major depressive disorder, without psychotic features (HCC)  6. Current smoker  7. Controlled substance agreement signed - ALPRAZolam (XANAX) 0.5 MG tablet; Take 1 tablet (0.5 mg total) by mouth 2 (two) times daily as needed for anxiety.  Dispense: 60 tablet; Refill: 5  8. Benzodiazepine dependence (HCC) - ALPRAZolam (XANAX) 0.5 MG tablet; Take 1 tablet (0.5 mg total) by mouth 2 (two) times daily as needed for anxiety.  Dispense: 60 tablet; Refill: 5  9. Hyperlipidemia, unspecified hyperlipidemia type  10. Gastroesophageal reflux disease without esophagitis - omeprazole (PRILOSEC) 20 MG capsule; Take 1 capsule (20 mg total) by mouth daily.  Dispense: 90 capsule; Refill: 1  Continue current medications  Continue current meds- low fat diet and exercise and recheck in 3 month Follow up in 6 months   Follow Up Instructions: I discussed the assessment and treatment plan with the patient. The patient was provided an opportunity to ask questions and all were answered. The patient agreed with the plan and demonstrated an understanding of the instructions.  A copy of instructions were sent to  the patient via MyChart unless otherwise noted below.     The patient was advised to call back or seek an in-person evaluation if the symptoms worsen or if the condition fails to improve as anticipated.  Time:  I spent 12 minutes with the patient via telehealth technology discussing the above problems/concerns.    Jannifer Rodney, FNP

## 2023-03-10 NOTE — Patient Instructions (Signed)
Health Maintenance, Male Adopting a healthy lifestyle and getting preventive care are important in promoting health and wellness. Ask your health care provider about: The right schedule for you to have regular tests and exams. Things you can do on your own to prevent diseases and keep yourself healthy. What should I know about diet, weight, and exercise? Eat a healthy diet  Eat a diet that includes plenty of vegetables, fruits, low-fat dairy products, and lean protein. Do not eat a lot of foods that are high in solid fats, added sugars, or sodium. Maintain a healthy weight Body mass index (BMI) is a measurement that can be used to identify possible weight problems. It estimates body fat based on height and weight. Your health care provider can help determine your BMI and help you achieve or maintain a healthy weight. Get regular exercise Get regular exercise. This is one of the most important things you can do for your health. Most adults should: Exercise for at least 150 minutes each week. The exercise should increase your heart rate and make you sweat (moderate-intensity exercise). Do strengthening exercises at least twice a week. This is in addition to the moderate-intensity exercise. Spend less time sitting. Even light physical activity can be beneficial. Watch cholesterol and blood lipids Have your blood tested for lipids and cholesterol at 39 years of age, then have this test every 5 years. You may need to have your cholesterol levels checked more often if: Your lipid or cholesterol levels are high. You are older than 40 years of age. You are at high risk for heart disease. What should I know about cancer screening? Many types of cancers can be detected early and may often be prevented. Depending on your health history and family history, you may need to have cancer screening at various ages. This may include screening for: Colorectal cancer. Prostate cancer. Skin cancer. Lung  cancer. What should I know about heart disease, diabetes, and high blood pressure? Blood pressure and heart disease High blood pressure causes heart disease and increases the risk of stroke. This is more likely to develop in people who have high blood pressure readings or are overweight. Talk with your health care provider about your target blood pressure readings. Have your blood pressure checked: Every 3-5 years if you are 18-39 years of age. Every year if you are 40 years old or older. If you are between the ages of 65 and 75 and are a current or former smoker, ask your health care provider if you should have a one-time screening for abdominal aortic aneurysm (AAA). Diabetes Have regular diabetes screenings. This checks your fasting blood sugar level. Have the screening done: Once every three years after age 45 if you are at a normal weight and have a low risk for diabetes. More often and at a younger age if you are overweight or have a high risk for diabetes. What should I know about preventing infection? Hepatitis B If you have a higher risk for hepatitis B, you should be screened for this virus. Talk with your health care provider to find out if you are at risk for hepatitis B infection. Hepatitis C Blood testing is recommended for: Everyone born from 1945 through 1965. Anyone with known risk factors for hepatitis C. Sexually transmitted infections (STIs) You should be screened each year for STIs, including gonorrhea and chlamydia, if: You are sexually active and are younger than 39 years of age. You are older than 39 years of age and your   health care provider tells you that you are at risk for this type of infection. Your sexual activity has changed since you were last screened, and you are at increased risk for chlamydia or gonorrhea. Ask your health care provider if you are at risk. Ask your health care provider about whether you are at high risk for HIV. Your health care provider  may recommend a prescription medicine to help prevent HIV infection. If you choose to take medicine to prevent HIV, you should first get tested for HIV. You should then be tested every 3 months for as long as you are taking the medicine. Follow these instructions at home: Alcohol use Do not drink alcohol if your health care provider tells you not to drink. If you drink alcohol: Limit how much you have to 0-2 drinks a day. Know how much alcohol is in your drink. In the [place] one drink equals one 12 oz bottle of beer (355 mL), one 5 oz glass of wine (148 mL), or one 1 oz glass of hard liquor (44 mL). Lifestyle Do not use any products that contain nicotine or tobacco. These products include cigarettes, chewing tobacco, and vaping devices, such as e-cigarettes. If you need help quitting, ask your health care provider. Do not use street drugs. Do not share needles. Ask your health care provider for help if you need support or information about quitting drugs. General instructions Schedule regular health, dental, and eye exams. Stay current with your vaccines. Tell your health care provider if: You often feel depressed. You have ever been abused or do not feel safe at home. Summary Adopting a healthy lifestyle and getting preventive care are important in promoting health and wellness. Follow your health care provider's instructions about healthy diet, exercising, and getting tested or screened for diseases. Follow your health care provider's instructions on monitoring your cholesterol and blood pressure. This information is not intended to replace advice given to you by your health care provider. Make sure you discuss any questions you have with your health care provider. Document Revised: 03/04/2021 Document Reviewed: 03/04/2021 Elsevier Patient Education  2023 Elsevier Inc.  

## 2023-04-13 ENCOUNTER — Encounter: Payer: Self-pay | Admitting: Family Medicine

## 2023-04-13 ENCOUNTER — Ambulatory Visit (INDEPENDENT_AMBULATORY_CARE_PROVIDER_SITE_OTHER): Payer: Managed Care, Other (non HMO)

## 2023-04-13 VITALS — BP 125/76 | HR 120 | Ht 72.0 in | Wt 248.0 lb

## 2023-04-13 DIAGNOSIS — A084 Viral intestinal infection, unspecified: Secondary | ICD-10-CM

## 2023-04-13 NOTE — Progress Notes (Signed)
BP 125/76   Pulse (!) 120   Ht 6' (1.829 m)   Wt 248 lb (112.5 kg)   SpO2 95%   BMI 33.63 kg/m    Subjective:   Patient ID: Jeffery Peters, male    DOB: 1985/10/31, 37 y.o.   MRN: [id number]  HPI: Jeffery Peters is a 37 y.o. male presenting on 04/13/2023 for Fatigue and Emesis   HPI Fatigue and nausea and vomiting and some diarrhea.  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  He was worried about heat exhaustion and started feeling very fatigued and then over the past few days he has had some nausea vomiting and diarrhea.  He says he then got a couple days off work and he started feeling better and started hydrating better and then went into an urgent care the day before yesterday and took some Zofran and has been taking it and he is feeling better from the nausea and vomiting and the diarrhea.  He says his energy is coming back and he is not feeling fatigued or exhausted anymore.  He is going back to work today if cleared.  He says for least 24 hours since nausea vomiting or diarrhea and that he is hydrating and his exhaustion is doing much better.  Relevant past medical, surgical, family and social history reviewed and updated as indicated. Interim medical history since our last visit reviewed. Allergies and medications reviewed and updated.  Review of Systems  Constitutional:  Negative for chills and fever.  Eyes:  Negative for discharge.  Respiratory:  Negative for shortness of breath and wheezing.   Cardiovascular:  Negative for chest pain and leg swelling.  Gastrointestinal:  Positive for diarrhea, nausea and vomiting. Negative for abdominal pain, blood in stool and constipation.  Musculoskeletal:  Negative for back pain and gait problem.  Skin:  Negative for rash.  All other systems reviewed and are negative.   Per HPI unless  specifically indicated above   Allergies as of 04/13/2023   No Known Allergies      Medication List        Accurate as of April 13, 2023  2:18 PM. If you have any questions, ask your nurse or doctor.          ALPRAZolam 0.5 MG tablet Commonly known as: XANAX Take 1 tablet (0.5 mg total) by mouth 2 (two) times daily as needed for anxiety.   hydrOXYzine 25 MG capsule Commonly known as: VISTARIL Take 1 capsule (25 mg total) by mouth every 8 (eight) hours as needed.   lisinopril 20 MG tablet Commonly known as: ZESTRIL Take 1 tablet (20 mg total) by mouth daily.   omeprazole 20 MG capsule Commonly known as: PRILOSEC Take 1 capsule (20 mg total) by mouth daily.   ondansetron 4 MG disintegrating tablet Commonly known as: ZOFRAN-ODT Take 4 mg by mouth every 8 (eight) hours as needed.   PARoxetine 40 MG tablet Commonly known as: PAXIL Take 1 tablet (40 mg total) by mouth daily.   rosuvastatin 10 MG tablet Commonly known as: Crestor Take 1 tablet (10 mg total) by mouth daily.   Taltz 80 MG/ML Soaj Generic drug: Ixekizumab Inject 1 Dose into the skin every 30 (thirty) days.         Objective:   BP 125/76  Pulse (!) 120   Ht 6' (1.829 m)   Wt 248 lb (112.5 kg)   SpO2 95%   BMI 33.63 kg/m   Wt Readings from Last 3 Encounters:  04/13/23 248 lb (112.5 kg)  12/09/22 246 lb 9.6 oz (111.9 kg)  11/14/22 234 lb 12.8 oz (106.5 kg)    Physical Exam Vitals and nursing note reviewed.  Constitutional:      General: He is not in acute distress.    Appearance: He is well-developed. He is not diaphoretic.  Eyes:     General: No scleral icterus.    Conjunctiva/sclera: Conjunctivae normal.  Neck:     Thyroid: No thyromegaly.  Cardiovascular:     Rate and Rhythm: Normal rate and regular rhythm.     Heart sounds: Normal heart sounds. No murmur heard. Pulmonary:     Effort: Pulmonary effort is normal. No respiratory distress.     Breath sounds: Normal breath sounds.  No wheezing.  Abdominal:     General: Abdomen is flat. Bowel sounds are normal. There is no distension.     Palpations: Abdomen is soft.     Tenderness: There is no abdominal tenderness. There is no guarding or rebound.     Hernia: No hernia is present.  Musculoskeletal:        General: Normal range of motion.     Cervical back: Neck supple.  Lymphadenopathy:     Cervical: No cervical adenopathy.  Skin:    General: Skin is warm and dry.     Findings: No rash.  Neurological:     Mental Status: He is alert and oriented to person, place, and time.     Coordination: Coordination normal.  Psychiatric:        Behavior: Behavior normal.       Assessment & Plan:   Problem List Items Addressed This Visit   None Visit Diagnoses     Viral gastroenteritis    -  Primary   Relevant Orders   CMP14+EGFR     Jeffery check blood counts to make sure he is hydrated, he has Zofran so he Jeffery continue to use that as needed but he seems like he is doing much better, sounds like viral gastroenteritis or maybe he had some heat exhaustion  Follow up plan: Return if symptoms worsen or fail to improve.  Counseling provided for all of the vaccine components Orders Placed This Encounter  Procedures   CMP14+EGFR    Arville Care, MD [facility] 04/13/2023, 2:18 PM

## 2023-04-14 LAB — CMP14+EGFR
ALT: 67 IU/L — ABNORMAL HIGH (ref 0–44)
AST: 188 IU/L — ABNORMAL HIGH (ref 0–40)
Albumin: 4.8 g/dL (ref 4.1–5.1)
Alkaline Phosphatase: 82 IU/L (ref 44–121)
BUN/Creatinine Ratio: 14 (ref 9–20)
BUN: 10 mg/dL (ref 6–20)
Bilirubin Total: 0.7 mg/dL (ref 0.0–1.2)
CO2: 23 mmol/L (ref 20–29)
Calcium: 10.2 mg/dL (ref 8.7–10.2)
Chloride: 101 mmol/L (ref 96–106)
Creatinine, Ser: 0.74 mg/dL — ABNORMAL LOW (ref 0.76–1.27)
Globulin, Total: 2.7 g/dL (ref 1.5–4.5)
Glucose: 105 mg/dL — ABNORMAL HIGH (ref 70–99)
Potassium: 3.7 mmol/L (ref 3.5–5.2)
Sodium: 140 mmol/L (ref 134–144)
Total Protein: 7.5 g/dL (ref 6.0–8.5)
eGFR: 119 mL/min/{1.73_m2} (ref >59)

## 2023-04-15 ENCOUNTER — Other Ambulatory Visit: Payer: Self-pay

## 2023-04-15 DIAGNOSIS — R945 Abnormal results of liver function studies: Secondary | ICD-10-CM

## 2023-04-15 DIAGNOSIS — R7989 Other specified abnormal findings of blood chemistry: Secondary | ICD-10-CM

## 2023-04-21 ENCOUNTER — Other Ambulatory Visit: Payer: Managed Care, Other (non HMO)

## 2023-04-21 DIAGNOSIS — R945 Abnormal results of liver function studies: Secondary | ICD-10-CM

## 2023-04-21 DIAGNOSIS — R7989 Other specified abnormal findings of blood chemistry: Secondary | ICD-10-CM

## 2023-04-22 LAB — HEPB+HEPC+HIV PANEL
HIV Screen 4th Generation wRfx: NONREACTIVE
Hep B C IgM: NEGATIVE
Hep B Core Total Ab: NEGATIVE
Hep B E Ab: NONREACTIVE
Hep B E Ag: NEGATIVE
Hep C Virus Ab: NONREACTIVE
Hepatitis B Surface Ag: NEGATIVE

## 2023-04-22 LAB — HEPATIC FUNCTION PANEL
ALT: 56 IU/L — ABNORMAL HIGH (ref 0–44)
AST: 71 IU/L — ABNORMAL HIGH (ref 0–40)
Albumin: 4.3 g/dL (ref 4.1–5.1)
Alkaline Phosphatase: 72 IU/L (ref 44–121)
Bilirubin Total: <0.2 mg/dL (ref 0.0–1.2)
Bilirubin, Direct: 0.11 mg/dL (ref 0.00–0.40)
Total Protein: 6.8 g/dL (ref 6.0–8.5)

## 2023-04-22 LAB — ACUTE HEP PANEL AND HEP B SURFACE AB
Hep A IgM: NEGATIVE
Hepatitis B Surf Ab Quant: <3.5 m[IU]/mL — ABNORMAL LOW (ref >9.9)

## 2023-09-15 ENCOUNTER — Ambulatory Visit (INDEPENDENT_AMBULATORY_CARE_PROVIDER_SITE_OTHER): Payer: Managed Care, Other (non HMO) | Admitting: Family

## 2023-09-15 ENCOUNTER — Encounter: Payer: Self-pay | Admitting: Family

## 2023-09-15 VITALS — BP 137/89 | HR 105 | Temp 98.1°F | Ht 72.0 in | Wt 248.6 lb

## 2023-09-15 DIAGNOSIS — I10 Essential (primary) hypertension: Secondary | ICD-10-CM

## 2023-09-15 DIAGNOSIS — Z79899 Other long term (current) drug therapy: Secondary | ICD-10-CM

## 2023-09-15 DIAGNOSIS — E669 Obesity, unspecified: Secondary | ICD-10-CM

## 2023-09-15 DIAGNOSIS — F411 Generalized anxiety disorder: Secondary | ICD-10-CM | POA: Diagnosis not present

## 2023-09-15 DIAGNOSIS — F332 Major depressive disorder, recurrent severe without psychotic features: Secondary | ICD-10-CM

## 2023-09-15 DIAGNOSIS — L409 Psoriasis, unspecified: Secondary | ICD-10-CM

## 2023-09-15 DIAGNOSIS — F1320 Sedative, hypnotic or anxiolytic dependence, uncomplicated: Secondary | ICD-10-CM

## 2023-09-15 DIAGNOSIS — F17200 Nicotine dependence, unspecified, uncomplicated: Secondary | ICD-10-CM

## 2023-09-15 DIAGNOSIS — E785 Hyperlipidemia, unspecified: Secondary | ICD-10-CM

## 2023-09-15 DIAGNOSIS — K219 Gastro-esophageal reflux disease without esophagitis: Secondary | ICD-10-CM

## 2023-09-15 MED ORDER — ALPRAZOLAM 0.5 MG PO TABS
0.5000 mg | ORAL_TABLET | Freq: Two times a day (BID) | ORAL | 2 refills | Status: DC | PRN
Start: 2023-09-15 — End: 2023-12-22

## 2023-09-15 NOTE — Patient Instructions (Signed)

## 2023-09-15 NOTE — Progress Notes (Signed)
Subjective:    Patient ID: Jeffery Peters, male    DOB: 06/12/1986, 37 y.o.   MRN: [id number]  Chief Complaint  Patient presents with   Medical Management of Chronic Issues   PT presents to the office today for chronic follow up. Pt has psoriasis and is followed by Dermatologists annually. States he has flare ups.  .  Hypertension This is a chronic problem. The current episode started more than 1 year ago. The problem has been resolved since onset. The problem is controlled. Associated symptoms include anxiety. Pertinent negatives include no malaise/fatigue, peripheral edema or shortness of breath. Risk factors for coronary artery disease include dyslipidemia and obesity. The current treatment provides moderate improvement.  Gastroesophageal Reflux He complains of belching, heartburn and a hoarse voice. This is a chronic problem. The current episode started in the past 7 days. Risk factors include obesity. He has tried a PPI for the symptoms. The treatment provided moderate relief.  Anxiety Presents for follow-up visit. Symptoms include excessive worry, nervous/anxious behavior and restlessness. Patient reports no shortness of breath. Symptoms occur occasionally. The severity of symptoms is mild.    Hyperlipidemia This is a chronic problem. The current episode started more than 1 year ago. The problem is controlled. Recent lipid tests were reviewed and are normal. Exacerbating diseases include obesity. Pertinent negatives include no shortness of breath. Current antihyperlipidemic treatment includes statins. The current treatment provides moderate improvement of lipids. Risk factors for coronary artery disease include dyslipidemia, hypertension, male sex and a sedentary lifestyle.  Depression        This is a chronic problem.  The current episode started more than 1 year ago.   The problem occurs intermittently.  Associated symptoms include restlessness.  Associated symptoms include no  helplessness, no hopelessness and not sad.  Past treatments include SSRIs - Selective serotonin reuptake inhibitors.  Past medical history includes anxiety.       Review of Systems  Constitutional:  Negative for malaise/fatigue.  HENT:  Positive for hoarse voice.   Respiratory:  Negative for shortness of breath.   Gastrointestinal:  Positive for heartburn.  Psychiatric/Behavioral:  Positive for depression. The patient is nervous/anxious.   All other systems reviewed and are negative.      Objective:   Physical Exam Vitals reviewed.  Constitutional:      General: He is not in acute distress.    Appearance: He is well-developed. He is obese.  HENT:     Head: Normocephalic.     Right Ear: Tympanic membrane normal.     Left Ear: Tympanic membrane normal.  Eyes:     General:        Right eye: No discharge.        Left eye: No discharge.     Pupils: Pupils are equal, round, and reactive to light.  Neck:     Thyroid: No thyromegaly.  Cardiovascular:     Rate and Rhythm: Normal rate and regular rhythm.     Heart sounds: Normal heart sounds. No murmur heard. Pulmonary:     Effort: Pulmonary effort is normal. No respiratory distress.     Breath sounds: Normal breath sounds. No wheezing.  Abdominal:     General: Bowel sounds are normal. There is no distension.     Palpations: Abdomen is soft.     Tenderness: There is no abdominal tenderness.  Musculoskeletal:        General: No tenderness. Normal range of motion.     Cervical  back: Normal range of motion and neck supple.  Skin:    General: Skin is warm and dry.     Findings: Rash present. No erythema.  Neurological:     Mental Status: He is alert and oriented to person, place, and time.     Cranial Nerves: No cranial nerve deficit.     Deep Tendon Reflexes: Reflexes are normal and symmetric.  Psychiatric:        Behavior: Behavior normal.        Thought Content: Thought content normal.        Judgment: Judgment normal.        BP 137/89   Pulse (!) 105   Temp 98.1 F (36.7 C) (Temporal)   Ht 6' (1.829 m)   Wt 248 lb 9.6 oz (112.8 kg)   SpO2 95%   BMI 33.72 kg/m      Assessment & Plan:  Jeffery Peters comes in today with chief complaint of Medical Management of Chronic Issues   Diagnosis and orders addressed:  1. GAD (generalized anxiety disorder) - ALPRAZolam (XANAX) 0.5 MG tablet; Take 1 tablet (0.5 mg total) by mouth 2 (two) times daily as needed for anxiety.  Dispense: 60 tablet; Refill: 2 - ToxASSURE Select 13 (MW), Urine  2. Controlled substance agreement signed - ALPRAZolam (XANAX) 0.5 MG tablet; Take 1 tablet (0.5 mg total) by mouth 2 (two) times daily as needed for anxiety.  Dispense: 60 tablet; Refill: 2 - ToxASSURE Select 13 (MW), Urine  3. Benzodiazepine dependence (HCC) - ALPRAZolam (XANAX) 0.5 MG tablet; Take 1 tablet (0.5 mg total) by mouth 2 (two) times daily as needed for anxiety.  Dispense: 60 tablet; Refill: 2 - ToxASSURE Select 13 (MW), Urine  4. Severe episode of recurrent major depressive disorder, without psychotic features (HCC)  5. Gastroesophageal reflux disease without esophagitis  6. Hyperlipidemia, unspecified hyperlipidemia type  7. Current smoker  8. Psoriasis  9. Obesity (BMI 30-39.9)  10. Essential hypertension   Labs pending Patient reviewed in [place] controlled database, no flags noted. Contract and drug screen are up to date.  Continue current medications, keep follow up with specialists Health Maintenance reviewed Diet and exercise encouraged  Follow up plan: 3 months    Jannifer Rodney, FNP

## 2023-09-18 LAB — TOXASSURE SELECT 13 (MW), URINE

## 2023-11-03 ENCOUNTER — Other Ambulatory Visit: Payer: Self-pay | Admitting: Family

## 2023-11-03 DIAGNOSIS — F411 Generalized anxiety disorder: Secondary | ICD-10-CM

## 2023-11-03 DIAGNOSIS — F410 Panic disorder [episodic paroxysmal anxiety] without agoraphobia: Secondary | ICD-10-CM

## 2023-12-22 ENCOUNTER — Encounter: Payer: Self-pay | Admitting: Family

## 2023-12-22 ENCOUNTER — Ambulatory Visit (INDEPENDENT_AMBULATORY_CARE_PROVIDER_SITE_OTHER): Payer: Managed Care, Other (non HMO) | Admitting: Family

## 2023-12-22 VITALS — BP 125/85 | HR 92 | Temp 97.0°F | Ht 72.0 in | Wt 244.2 lb

## 2023-12-22 DIAGNOSIS — F1320 Sedative, hypnotic or anxiolytic dependence, uncomplicated: Secondary | ICD-10-CM

## 2023-12-22 DIAGNOSIS — Z0000 Encounter for general adult medical examination without abnormal findings: Secondary | ICD-10-CM

## 2023-12-22 DIAGNOSIS — K219 Gastro-esophageal reflux disease without esophagitis: Secondary | ICD-10-CM

## 2023-12-22 DIAGNOSIS — E669 Obesity, unspecified: Secondary | ICD-10-CM

## 2023-12-22 DIAGNOSIS — F411 Generalized anxiety disorder: Secondary | ICD-10-CM | POA: Diagnosis not present

## 2023-12-22 DIAGNOSIS — I10 Essential (primary) hypertension: Secondary | ICD-10-CM

## 2023-12-22 DIAGNOSIS — F17200 Nicotine dependence, unspecified, uncomplicated: Secondary | ICD-10-CM

## 2023-12-22 DIAGNOSIS — F332 Major depressive disorder, recurrent severe without psychotic features: Secondary | ICD-10-CM

## 2023-12-22 DIAGNOSIS — L409 Psoriasis, unspecified: Secondary | ICD-10-CM

## 2023-12-22 DIAGNOSIS — F410 Panic disorder [episodic paroxysmal anxiety] without agoraphobia: Secondary | ICD-10-CM

## 2023-12-22 DIAGNOSIS — Z0001 Encounter for general adult medical examination with abnormal findings: Secondary | ICD-10-CM | POA: Diagnosis not present

## 2023-12-22 DIAGNOSIS — E785 Hyperlipidemia, unspecified: Secondary | ICD-10-CM

## 2023-12-22 DIAGNOSIS — Z79899 Other long term (current) drug therapy: Secondary | ICD-10-CM | POA: Diagnosis not present

## 2023-12-22 MED ORDER — LISINOPRIL 20 MG PO TABS: 20.0000 mg | ORAL_TABLET | Freq: Every day | ORAL | 3 refills | Status: DC

## 2023-12-22 MED ORDER — HYDROXYZINE PAMOATE 25 MG PO CAPS
25.0000 mg | ORAL_CAPSULE | Freq: Three times a day (TID) | ORAL | 0 refills | Status: DC | PRN
Start: 2023-12-22 — End: 2024-03-23

## 2023-12-22 MED ORDER — ALPRAZOLAM 0.5 MG PO TABS
0.5000 mg | ORAL_TABLET | Freq: Two times a day (BID) | ORAL | 2 refills | Status: DC | PRN
Start: 2023-12-22 — End: 2024-03-24

## 2023-12-22 MED ORDER — PAROXETINE HCL 40 MG PO TABS
40.0000 mg | ORAL_TABLET | Freq: Every day | ORAL | 2 refills | Status: DC
Start: 2023-12-22 — End: 2024-07-06

## 2023-12-22 NOTE — Progress Notes (Signed)
 Subjective:    Patient ID: Jeffery Peters, male    DOB: 09-15-1986, 38 y.o.   MRN: [id number]  Chief Complaint  Patient presents with   Medical Management of Chronic Issues   PT presents to the office today for CPE and chronic follow up. Pt has psoriasis and is followed by Dermatologists annually. States he has flare ups.   Hypertension This is a chronic problem. The current episode started more than 1 year ago. The problem has been resolved since onset. The problem is controlled. Associated symptoms include anxiety. Pertinent negatives include no malaise/fatigue, peripheral edema or shortness of breath. Risk factors for coronary artery disease include dyslipidemia and obesity. The current treatment provides moderate improvement.  Gastroesophageal Reflux He complains of belching, heartburn and a hoarse voice. This is a chronic problem. The current episode started in the past 7 days. Risk factors include obesity. He has tried a PPI for the symptoms. The treatment provided moderate relief.  Anxiety Presents for follow-up visit. Symptoms include excessive worry, nervous/anxious behavior and restlessness. Patient reports no shortness of breath. Symptoms occur occasionally. The severity of symptoms is mild.    Hyperlipidemia This is a chronic problem. The current episode started more than 1 year ago. The problem is controlled. Recent lipid tests were reviewed and are normal. Exacerbating diseases include obesity. Pertinent negatives include no shortness of breath. Current antihyperlipidemic treatment includes statins. The current treatment provides moderate improvement of lipids. Risk factors for coronary artery disease include dyslipidemia, hypertension, male sex and a sedentary lifestyle.  Depression        This is a chronic problem.  The current episode started more than 1 year ago.   The problem occurs intermittently.  Associated symptoms include restlessness.  Associated symptoms include no  helplessness, no hopelessness and not sad.  Past treatments include SSRIs - Selective serotonin reuptake inhibitors.  Past medical history includes anxiety.       Review of Systems  Constitutional:  Negative for malaise/fatigue.  HENT:  Positive for hoarse voice.   Respiratory:  Negative for shortness of breath.   Gastrointestinal:  Positive for heartburn.  Psychiatric/Behavioral:  The patient is nervous/anxious.   All other systems reviewed and are negative.  Family History  Problem Relation Age of Onset   Cancer Father        lung   Social History   Socioeconomic History   Marital status: Single    Spouse name: Not on file   Number of children: Not on file   Years of education: Not on file   Highest education level: Not on file  Occupational History   Occupation: cook  Tobacco Use   Smoking status: Heavy Smoker    Current packs/day: 1.50    Types: Cigarettes   Smokeless tobacco: Never  Vaping Use   Vaping status: Never Used  Substance and Sexual Activity   Alcohol use: Yes   Drug use: No   Sexual activity: Not on file  Other Topics Concern   Not on file  Social History Narrative   Lives alone.  Works [employer].     Social Drivers of Corporate investment banker Strain: Not on file  Food Insecurity: Not on file  Transportation Needs: Not on file  Physical Activity: Not on file  Stress: Not on file  Social Connections: Not on file       Objective:   Physical Exam Vitals reviewed.  Constitutional:      General: He is not in  acute distress.    Appearance: He is well-developed. He is obese.  HENT:     Head: Normocephalic.     Right Ear: Tympanic membrane normal.     Left Ear: Tympanic membrane normal.  Eyes:     General:        Right eye: No discharge.        Left eye: No discharge.     Pupils: Pupils are equal, round, and reactive to light.  Neck:     Thyroid: No thyromegaly.  Cardiovascular:     Rate and Rhythm: Normal rate and regular rhythm.      Heart sounds: Normal heart sounds. No murmur heard. Pulmonary:     Effort: Pulmonary effort is normal. No respiratory distress.     Breath sounds: Normal breath sounds. No wheezing.  Abdominal:     General: Bowel sounds are normal. There is no distension.     Palpations: Abdomen is soft.     Tenderness: There is no abdominal tenderness.  Musculoskeletal:        General: No tenderness. Normal range of motion.     Cervical back: Normal range of motion and neck supple.  Skin:    General: Skin is warm and dry.     Findings: Rash present. No erythema.     Comments: Dry erythemas skin of bilateral hands  Neurological:     Mental Status: He is alert and oriented to person, place, and time.     Cranial Nerves: No cranial nerve deficit.     Deep Tendon Reflexes: Reflexes are normal and symmetric.  Psychiatric:        Behavior: Behavior normal.        Thought Content: Thought content normal.        Judgment: Judgment normal.       BP 125/85   Pulse 92   Temp (!) 97 F (36.1 C) (Temporal)   Ht 6' (1.829 m)   Wt 244 lb 3.2 oz (110.8 kg)   SpO2 96%   BMI 33.12 kg/m      Assessment & Plan:  Ibrahem A Deshotels comes in today with chief complaint of Medical Management of Chronic Issues   Diagnosis and orders addressed:  1. GAD (generalized anxiety disorder) - ALPRAZolam (XANAX) 0.5 MG tablet; Take 1 tablet (0.5 mg total) by mouth 2 (two) times daily as needed for anxiety.  Dispense: 60 tablet; Refill: 2 - hydrOXYzine (VISTARIL) 25 MG capsule; Take 1 capsule (25 mg total) by mouth every 8 (eight) hours as needed.  Dispense: 270 capsule; Refill: 0 - PARoxetine (PAXIL) 40 MG tablet; Take 1 tablet (40 mg total) by mouth daily.  Dispense: 90 tablet; Refill: 2 - CMP14+EGFR  2. Controlled substance agreement signed - ALPRAZolam (XANAX) 0.5 MG tablet; Take 1 tablet (0.5 mg total) by mouth 2 (two) times daily as needed for anxiety.  Dispense: 60 tablet; Refill: 2 - CMP14+EGFR  3.  Benzodiazepine dependence (HCC) - ALPRAZolam (XANAX) 0.5 MG tablet; Take 1 tablet (0.5 mg total) by mouth 2 (two) times daily as needed for anxiety.  Dispense: 60 tablet; Refill: 2 - CMP14+EGFR  4. Panic attack - hydrOXYzine (VISTARIL) 25 MG capsule; Take 1 capsule (25 mg total) by mouth every 8 (eight) hours as needed.  Dispense: 270 capsule; Refill: 0 - CMP14+EGFR  5. Severe episode of recurrent major depressive disorder, without psychotic features (HCC) - PARoxetine (PAXIL) 40 MG tablet; Take 1 tablet (40 mg total) by mouth daily.  Dispense: 90  tablet; Refill: 2 - CMP14+EGFR  6. Current smoker - CMP14+EGFR  7. Essential hypertension - CMP14+EGFR - lisinopril (ZESTRIL) 20 MG tablet; Take 1 tablet (20 mg total) by mouth daily.  Dispense: 90 tablet; Refill: 3  8. Gastroesophageal reflux disease without esophagitis - CMP14+EGFR  9. Hyperlipidemia, unspecified hyperlipidemia type - CMP14+EGFR - Lipid panel  10. Obesity (BMI 30-39.9) - CMP14+EGFR  11. Psoriasis - CMP14+EGFR  12. Annual physical exam (Primary) - CMP14+EGFR - CBC with Differential/Platelet - Lipid panel   Labs pending Patient reviewed in [place] controlled database, no flags noted. Contract and drug screen are up to date.  Continue current medications, keep follow up with specialists Health Maintenance reviewed Diet and exercise encouraged  Follow up plan: 3 months    Jannifer Rodney, FNP

## 2023-12-22 NOTE — Patient Instructions (Signed)

## 2023-12-23 LAB — CMP14+EGFR
ALT: 23 [IU]/L (ref 0–44)
AST: 47 [IU]/L — ABNORMAL HIGH (ref 0–40)
Albumin: 4.5 g/dL (ref 4.1–5.1)
Alkaline Phosphatase: 90 [IU]/L (ref 44–121)
BUN/Creatinine Ratio: 20 (ref 9–20)
BUN: 14 mg/dL (ref 6–20)
Bilirubin Total: 0.4 mg/dL (ref 0.0–1.2)
CO2: 26 mmol/L (ref 20–29)
Calcium: 9.8 mg/dL (ref 8.7–10.2)
Chloride: 98 mmol/L (ref 96–106)
Creatinine, Ser: 0.7 mg/dL — ABNORMAL LOW (ref 0.76–1.27)
Globulin, Total: 2.7 g/dL (ref 1.5–4.5)
Glucose: 86 mg/dL (ref 70–99)
Potassium: 4.5 mmol/L (ref 3.5–5.2)
Sodium: 139 mmol/L (ref 134–144)
Total Protein: 7.2 g/dL (ref 6.0–8.5)
eGFR: 120 mL/min/{1.73_m2} (ref >59)

## 2023-12-23 LAB — CBC WITH DIFFERENTIAL/PLATELET
Basophils Absolute: 0.1 10*3/uL (ref 0.0–0.2)
Basos: 2 %
EOS (ABSOLUTE): 0.2 10*3/uL (ref 0.0–0.4)
Eos: 5 %
Hematocrit: 43.1 % (ref 37.5–51.0)
Hemoglobin: 14 g/dL (ref 13.0–17.7)
Immature Grans (Abs): 0 10*3/uL (ref 0.0–0.1)
Immature Granulocytes: 0 %
Lymphocytes Absolute: 1 10*3/uL (ref 0.7–3.1)
Lymphs: 31 %
MCH: 29.7 pg (ref 26.6–33.0)
MCHC: 32.5 g/dL (ref 31.5–35.7)
MCV: 92 fL (ref 79–97)
Monocytes Absolute: 0.4 10*3/uL (ref 0.1–0.9)
Monocytes: 11 %
Neutrophils Absolute: 1.7 10*3/uL (ref 1.4–7.0)
Neutrophils: 51 %
Platelets: 213 10*3/uL (ref 150–450)
RBC: 4.71 x10E6/uL (ref 4.14–5.80)
RDW: 13 % (ref 11.6–15.4)
WBC: 3.4 10*3/uL (ref 3.4–10.8)

## 2023-12-23 LAB — LIPID PANEL
Chol/HDL Ratio: 3.1 {ratio} (ref 0.0–5.0)
Cholesterol, Total: 163 mg/dL (ref 100–199)
HDL: 53 mg/dL (ref >39)
LDL Chol Calc (NIH): 54 mg/dL (ref 0–99)
Triglycerides: 375 mg/dL — ABNORMAL HIGH (ref 0–149)
VLDL Cholesterol Cal: 56 mg/dL — ABNORMAL HIGH (ref 5–40)

## 2023-12-29 ENCOUNTER — Encounter: Payer: Self-pay | Admitting: Family Medicine

## 2024-01-20 ENCOUNTER — Ambulatory Visit (INDEPENDENT_AMBULATORY_CARE_PROVIDER_SITE_OTHER)

## 2024-01-20 DIAGNOSIS — Z111 Encounter for screening for respiratory tuberculosis: Secondary | ICD-10-CM | POA: Diagnosis not present

## 2024-01-20 NOTE — Progress Notes (Signed)
 Patient is in office today for a nurse visit for PPD, place left lower forearm - patient to come back Friday 12/25/2023 for read.

## 2024-01-21 ENCOUNTER — Other Ambulatory Visit: Payer: Self-pay | Admitting: Family

## 2024-01-22 ENCOUNTER — Ambulatory Visit

## 2024-01-22 DIAGNOSIS — Z111 Encounter for screening for respiratory tuberculosis: Secondary | ICD-10-CM

## 2024-01-22 LAB — TB SKIN TEST
Induration: 0 mm
TB Skin Test: NEGATIVE

## 2024-01-29 LAB — QUANTIFERON-TB GOLD PLUS
QuantiFERON Mitogen Value: 7.84 [IU]/mL
QuantiFERON Nil Value: 0.35 [IU]/mL
QuantiFERON TB1 Ag Value: 0.51 [IU]/mL
QuantiFERON TB2 Ag Value: 0.51 [IU]/mL

## 2024-03-22 ENCOUNTER — Other Ambulatory Visit: Payer: Self-pay | Admitting: Family

## 2024-03-22 DIAGNOSIS — K219 Gastro-esophageal reflux disease without esophagitis: Secondary | ICD-10-CM

## 2024-03-23 ENCOUNTER — Other Ambulatory Visit: Payer: Self-pay | Admitting: Family

## 2024-03-23 DIAGNOSIS — F410 Panic disorder [episodic paroxysmal anxiety] without agoraphobia: Secondary | ICD-10-CM

## 2024-03-23 DIAGNOSIS — F411 Generalized anxiety disorder: Secondary | ICD-10-CM

## 2024-03-24 ENCOUNTER — Encounter: Payer: Self-pay | Admitting: Family

## 2024-03-24 ENCOUNTER — Ambulatory Visit (INDEPENDENT_AMBULATORY_CARE_PROVIDER_SITE_OTHER): Payer: Managed Care, Other (non HMO) | Admitting: Family

## 2024-03-24 VITALS — BP 131/86 | HR 85 | Temp 97.1°F | Ht 72.0 in | Wt 245.0 lb

## 2024-03-24 DIAGNOSIS — E785 Hyperlipidemia, unspecified: Secondary | ICD-10-CM

## 2024-03-24 DIAGNOSIS — Z79899 Other long term (current) drug therapy: Secondary | ICD-10-CM

## 2024-03-24 DIAGNOSIS — F17200 Nicotine dependence, unspecified, uncomplicated: Secondary | ICD-10-CM

## 2024-03-24 DIAGNOSIS — I10 Essential (primary) hypertension: Secondary | ICD-10-CM

## 2024-03-24 DIAGNOSIS — K219 Gastro-esophageal reflux disease without esophagitis: Secondary | ICD-10-CM

## 2024-03-24 DIAGNOSIS — E669 Obesity, unspecified: Secondary | ICD-10-CM

## 2024-03-24 DIAGNOSIS — F410 Panic disorder [episodic paroxysmal anxiety] without agoraphobia: Secondary | ICD-10-CM | POA: Diagnosis not present

## 2024-03-24 DIAGNOSIS — F1320 Sedative, hypnotic or anxiolytic dependence, uncomplicated: Secondary | ICD-10-CM | POA: Diagnosis not present

## 2024-03-24 DIAGNOSIS — F411 Generalized anxiety disorder: Secondary | ICD-10-CM | POA: Diagnosis not present

## 2024-03-24 DIAGNOSIS — L409 Psoriasis, unspecified: Secondary | ICD-10-CM

## 2024-03-24 DIAGNOSIS — F332 Major depressive disorder, recurrent severe without psychotic features: Secondary | ICD-10-CM

## 2024-03-24 MED ORDER — ROSUVASTATIN CALCIUM 10 MG PO TABS: 10.0000 mg | ORAL_TABLET | Freq: Every day | ORAL | 2 refills | Status: DC

## 2024-03-24 MED ORDER — ALPRAZOLAM 0.5 MG PO TABS
0.5000 mg | ORAL_TABLET | Freq: Two times a day (BID) | ORAL | 2 refills | Status: DC | PRN
Start: 2024-03-24 — End: 2024-07-06

## 2024-03-24 MED ORDER — HYDROXYZINE PAMOATE 25 MG PO CAPS: 25.0000 mg | ORAL_CAPSULE | Freq: Three times a day (TID) | ORAL | 0 refills | Status: DC | PRN

## 2024-03-24 NOTE — Patient Instructions (Signed)
 Hypertension, Adult High blood pressure (hypertension) is when the force of blood pumping through the arteries is too strong. The arteries are the blood vessels that carry blood from the heart throughout the body. Hypertension forces the heart to work harder to pump blood and may cause arteries to become narrow or stiff. Untreated or uncontrolled hypertension can lead to a heart attack, heart failure, a stroke, kidney disease, and other problems. A blood pressure reading consists of a higher number over a lower number. Ideally, your blood pressure should be below 120/80. The first ("top") number is called the systolic pressure. It is a measure of the pressure in your arteries as your heart beats. The second ("bottom") number is called the diastolic pressure. It is a measure of the pressure in your arteries as the heart relaxes. What are the causes? The exact cause of this condition is not known. There are some conditions that result in high blood pressure. What increases the risk? Certain factors may make you more likely to develop high blood pressure. Some of these risk factors are under your control, including: Smoking. Not getting enough exercise or physical activity. Being overweight. Having too much fat, sugar, calories, or salt (sodium) in your diet. Drinking too much alcohol. Other risk factors include: Having a personal history of heart disease, diabetes, high cholesterol, or kidney disease. Stress. Having a family history of high blood pressure and high cholesterol. Having obstructive sleep apnea. Age. The risk increases with age. What are the signs or symptoms? High blood pressure may not cause symptoms. Very high blood pressure (hypertensive crisis) may cause: Headache. Fast or irregular heartbeats (palpitations). Shortness of breath. Nosebleed. Nausea and vomiting. Vision changes. Severe chest pain, dizziness, and seizures. How is this diagnosed? This condition is diagnosed by  measuring your blood pressure while you are seated, with your arm resting on a flat surface, your legs uncrossed, and your feet flat on the floor. The cuff of the blood pressure monitor will be placed directly against the skin of your upper arm at the level of your heart. Blood pressure should be measured at least twice using the same arm. Certain conditions can cause a difference in blood pressure between your right and left arms. If you have a high blood pressure reading during one visit or you have normal blood pressure with other risk factors, you may be asked to: Return on a different day to have your blood pressure checked again. Monitor your blood pressure at home for 1 week or longer. If you are diagnosed with hypertension, you may have other blood or imaging tests to help your health care provider understand your overall risk for other conditions. How is this treated? This condition is treated by making healthy lifestyle changes, such as eating healthy foods, exercising more, and reducing your alcohol intake. You may be referred for counseling on a healthy diet and physical activity. Your health care provider may prescribe medicine if lifestyle changes are not enough to get your blood pressure under control and if: Your systolic blood pressure is above 130. Your diastolic blood pressure is above 80. Your personal target blood pressure may vary depending on your medical conditions, your age, and other factors. Follow these instructions at home: Eating and drinking  Eat a diet that is high in fiber and potassium, and low in sodium, added sugar, and fat. An example of this eating plan is called the DASH diet. DASH stands for Dietary Approaches to Stop Hypertension. To eat this way: Eat  plenty of fresh fruits and vegetables. Try to fill one half of your plate at each meal with fruits and vegetables. Eat whole grains, such as whole-wheat pasta, brown rice, or whole-grain bread. Fill about one  fourth of your plate with whole grains. Eat or drink low-fat dairy products, such as skim milk or low-fat yogurt. Avoid fatty cuts of meat, processed or cured meats, and poultry with skin. Fill about one fourth of your plate with lean proteins, such as fish, chicken without skin, beans, eggs, or tofu. Avoid pre-made and processed foods. These tend to be higher in sodium, added sugar, and fat. Reduce your daily sodium intake. Many people with hypertension should eat less than 1,500 mg of sodium a day. Do not drink alcohol if: Your health care provider tells you not to drink. You are pregnant, may be pregnant, or are planning to become pregnant. If you drink alcohol: Limit how much you have to: 0-1 drink a day for women. 0-2 drinks a day for men. Know how much alcohol is in your drink. In the [place] one drink equals one 12 oz bottle of beer (355 mL), one 5 oz glass of wine (148 mL), or one 1 oz glass of hard liquor (44 mL). Lifestyle  Work with your health care provider to maintain a healthy body weight or to lose weight. Ask what an ideal weight is for you. Get at least 30 minutes of exercise that causes your heart to beat faster (aerobic exercise) most days of the week. Activities may include walking, swimming, or biking. Include exercise to strengthen your muscles (resistance exercise), such as Pilates or lifting weights, as part of your weekly exercise routine. Try to do these types of exercises for 30 minutes at least 3 days a week. Do not use any products that contain nicotine or tobacco. These products include cigarettes, chewing tobacco, and vaping devices, such as e-cigarettes. If you need help quitting, ask your health care provider. Monitor your blood pressure at home as told by your health care provider. Keep all follow-up visits. This is important. Medicines Take over-the-counter and prescription medicines only as told by your health care provider. Follow directions carefully. Blood  pressure medicines must be taken as prescribed. Do not skip doses of blood pressure medicine. Doing this puts you at risk for problems and can make the medicine less effective. Ask your health care provider about side effects or reactions to medicines that you should watch for. Contact a health care provider if you: Think you are having a reaction to a medicine you are taking. Have headaches that keep coming back (recurring). Feel dizzy. Have swelling in your ankles. Have trouble with your vision. Get help right away if you: Develop a severe headache or confusion. Have unusual weakness or numbness. Feel faint. Have severe pain in your chest or abdomen. Vomit repeatedly. Have trouble breathing. These symptoms may be an emergency. Get help right away. Call 911. Do not wait to see if the symptoms will go away. Do not drive yourself to the hospital. Summary Hypertension is when the force of blood pumping through your arteries is too strong. If this condition is not controlled, it may put you at risk for serious complications. Your personal target blood pressure may vary depending on your medical conditions, your age, and other factors. For most people, a normal blood pressure is less than 120/80. Hypertension is treated with lifestyle changes, medicines, or a combination of both. Lifestyle changes include losing weight, eating a healthy,  low-sodium diet, exercising more, and limiting alcohol. This information is not intended to replace advice given to you by your health care provider. Make sure you discuss any questions you have with your health care provider. Document Revised: 08/20/2021 Document Reviewed: 08/20/2021 Elsevier Patient Education  2024 [employer].

## 2024-03-24 NOTE — Progress Notes (Signed)
 Subjective:    Patient ID: Jeffery Peters, male    DOB: [birth year], 38 y.o.   MRN: [id number]  Chief Complaint  Patient presents with   Medical Management of Chronic Issues    PATIENT STATES HE HAD A MENTAL BREAK DOWN AT WORK. UNCLE HAS PASSED    PT presents to the office today for chronic follow up.   Pt has psoriasis and is followed by Dermatologists annually. States he has flare ups.   Hypertension This is a chronic problem. The current episode started more than 1 year ago. The problem has been waxing and waning since onset. The problem is uncontrolled. Associated symptoms include anxiety. Pertinent negatives include no malaise/fatigue, peripheral edema or shortness of breath. Risk factors for coronary artery disease include dyslipidemia and obesity. The current treatment provides moderate improvement.  Gastroesophageal Reflux He complains of belching, heartburn and a hoarse voice. This is a chronic problem. The current episode started in the past 7 days. The problem occurs occasionally. Risk factors include obesity. He has tried a PPI for the symptoms. The treatment provided moderate relief.  Anxiety Presents for follow-up visit. Symptoms include excessive worry, nervous/anxious behavior and restlessness. Patient reports no shortness of breath. Symptoms occur occasionally. The severity of symptoms is mild.    Hyperlipidemia This is a chronic problem. The current episode started more than 1 year ago. The problem is controlled. Recent lipid tests were reviewed and are normal. Exacerbating diseases include obesity. Pertinent negatives include no shortness of breath. Current antihyperlipidemic treatment includes statins. The current treatment provides moderate improvement of lipids. Risk factors for coronary artery disease include dyslipidemia, hypertension, male sex and a sedentary lifestyle.  Depression        This is a chronic problem.  The current episode started more than 1 year ago.    The problem occurs intermittently.  Associated symptoms include restlessness.  Associated symptoms include no helplessness, no hopelessness and not sad.  Past treatments include SSRIs - Selective serotonin reuptake inhibitors.  Past medical history includes anxiety.   Nicotine Dependence Presents for follow-up visit. His urge triggers include company of smokers. The symptoms have been stable. He smokes 1 pack of cigarettes per day.      Review of Systems  Constitutional:  Negative for malaise/fatigue.  HENT:  Positive for hoarse voice.   Respiratory:  Negative for shortness of breath.   Gastrointestinal:  Positive for heartburn.  Psychiatric/Behavioral:  The patient is nervous/anxious.   All other systems reviewed and are negative.  Family History  Problem Relation Age of Onset   Cancer Father        lung   Social History   Socioeconomic History   Marital status: Single    Spouse name: Not on file   Number of children: Not on file   Years of education: Not on file   Highest education level: Not on file  Occupational History   Occupation: cook  Tobacco Use   Smoking status: Heavy Smoker    Current packs/day: 1.50    Types: Cigarettes   Smokeless tobacco: Never  Vaping Use   Vaping status: Never Used  Substance and Sexual Activity   Alcohol use: Yes   Drug use: No   Sexual activity: Not on file  Other Topics Concern   Not on file  Social History Narrative   Lives alone.  Works [employer].     Social Drivers of Corporate investment banker Strain: Not on file  Food Insecurity: Not  on file  Transportation Needs: Not on file  Physical Activity: Not on file  Stress: Not on file  Social Connections: Not on file       Objective:   Physical Exam Vitals reviewed.  Constitutional:      General: He is not in acute distress.    Appearance: He is well-developed. He is obese.  HENT:     Head: Normocephalic.     Right Ear: Tympanic membrane normal.     Left Ear: Tympanic  membrane normal.  Eyes:     General:        Right eye: No discharge.        Left eye: No discharge.     Pupils: Pupils are equal, round, and reactive to light.  Neck:     Thyroid: No thyromegaly.  Cardiovascular:     Rate and Rhythm: Normal rate and regular rhythm.     Heart sounds: Normal heart sounds. No murmur heard. Pulmonary:     Effort: Pulmonary effort is normal. No respiratory distress.     Breath sounds: Normal breath sounds. No wheezing.  Abdominal:     General: Bowel sounds are normal. There is no distension.     Palpations: Abdomen is soft.     Tenderness: There is no abdominal tenderness.  Musculoskeletal:        General: No tenderness. Normal range of motion.     Cervical back: Normal range of motion and neck supple.  Skin:    General: Skin is warm and dry.     Findings: Rash present. No erythema.     Comments: Dry erythemas skin of bilateral hands  Neurological:     Mental Status: He is alert and oriented to person, place, and time.     Cranial Nerves: No cranial nerve deficit.     Deep Tendon Reflexes: Reflexes are normal and symmetric.  Psychiatric:        Behavior: Behavior normal.        Thought Content: Thought content normal.        Judgment: Judgment normal.       BP (!) 142/97   Pulse 85   Temp (!) 97.1 F (36.2 C) (Temporal)   Ht 6' (1.829 m)   Wt 245 lb (111.1 kg)   BMI 33.23 kg/m      Assessment & Plan:  Marcos A Hickel comes in today with chief complaint of Medical Management of Chronic Issues (PATIENT STATES HE HAD A MENTAL BREAK DOWN AT WORK. UNCLE HAS PASSED )   Diagnosis and orders addressed:  1. GAD (generalized anxiety disorder) (Primary) - ALPRAZolam  (XANAX ) 0.5 MG tablet; Take 1 tablet (0.5 mg total) by mouth 2 (two) times daily as needed for anxiety.  Dispense: 60 tablet; Refill: 2 - hydrOXYzine  (VISTARIL ) 25 MG capsule; Take 1 capsule (25 mg total) by mouth every 8 (eight) hours as needed.  Dispense: 270 capsule; Refill:  0  2. Controlled substance agreement signed - ALPRAZolam  (XANAX ) 0.5 MG tablet; Take 1 tablet (0.5 mg total) by mouth 2 (two) times daily as needed for anxiety.  Dispense: 60 tablet; Refill: 2  3. Benzodiazepine dependence (HCC) - ALPRAZolam  (XANAX ) 0.5 MG tablet; Take 1 tablet (0.5 mg total) by mouth 2 (two) times daily as needed for anxiety.  Dispense: 60 tablet; Refill: 2  4. Panic attack - hydrOXYzine  (VISTARIL ) 25 MG capsule; Take 1 capsule (25 mg total) by mouth every 8 (eight) hours as needed.  Dispense: 270 capsule; Refill: 0  5. Severe episode of recurrent major depressive disorder, without psychotic features (HCC)  6. Essential hypertension  7. Obesity (BMI 30-39.9)  8. Psoriasis   9. Current smoker Smoking cessation discussed  10. Hyperlipidemia, unspecified hyperlipidemia type - rosuvastatin  (CRESTOR ) 10 MG tablet; Take 1 tablet (10 mg total) by mouth daily.  Dispense: 90 tablet; Refill: 2  11. Gastroesophageal reflux disease without esophagitis  Labs pending Patient reviewed in [place] controlled database, no flags noted. Contract and drug screen are up to date.  Continue current medications, keep follow up with specialists Health Maintenance reviewed Diet and exercise encouraged  Follow up plan: 3 months    Tommas Fragmin, FNP

## 2024-04-05 ENCOUNTER — Encounter: Payer: Self-pay | Admitting: Family

## 2024-04-05 ENCOUNTER — Ambulatory Visit (INDEPENDENT_AMBULATORY_CARE_PROVIDER_SITE_OTHER): Admitting: Family

## 2024-04-05 VITALS — BP 91/62 | HR 113 | Temp 97.0°F | Ht 72.0 in | Wt 244.0 lb

## 2024-04-05 DIAGNOSIS — R42 Dizziness and giddiness: Secondary | ICD-10-CM

## 2024-04-05 DIAGNOSIS — R197 Diarrhea, unspecified: Secondary | ICD-10-CM

## 2024-04-05 DIAGNOSIS — R000 Tachycardia, unspecified: Secondary | ICD-10-CM | POA: Diagnosis not present

## 2024-04-05 DIAGNOSIS — E860 Dehydration: Secondary | ICD-10-CM

## 2024-04-05 DIAGNOSIS — I959 Hypotension, unspecified: Secondary | ICD-10-CM | POA: Diagnosis not present

## 2024-04-05 DIAGNOSIS — R531 Weakness: Secondary | ICD-10-CM

## 2024-04-05 NOTE — Progress Notes (Signed)
 Subjective:    Patient ID: Jeffery Peters, male    DOB: [birth year], 38 y.o.   MRN: [id number]  Chief Complaint  Patient presents with   Hypertension    Patient walked in saying that his blood pressure over the last week or so has been low 90's over 60. Patient states that he has been getting dizzy, feeling light headed, and feeling like he may pass out.    PT walks in the office today with complaints of dizzy, light headedness, and hypotension. Reports he had vomiting and diarrhea on 03/31/24 and over the weekend he checked his BP and it 110/60's, 80's/50's, 120's/60's.   He has been holding lisinopril .   Diarrhea  This is a new problem. The current episode started 1 to 4 weeks ago. The problem occurs 5 to 10 times per day. The problem has been waxing and waning. The stool consistency is described as Watery. Associated symptoms include vomiting. Pertinent negatives include no fever, headaches or increased  flatus. The treatment provided mild relief.      Review of Systems  Constitutional:  Negative for fever.  Gastrointestinal:  Positive for diarrhea and vomiting. Negative for flatus.  Neurological:  Negative for headaches.  All other systems reviewed and are negative.   Social History   Socioeconomic History   Marital status: Single    Spouse name: Not on file   Number of children: Not on file   Years of education: Not on file   Highest education level: Not on file  Occupational History   Occupation: cook  Tobacco Use   Smoking status: Heavy Smoker    Current packs/day: 1.50    Types: Cigarettes   Smokeless tobacco: Never  Vaping Use   Vaping status: Never Used  Substance and Sexual Activity   Alcohol use: Yes   Drug use: No   Sexual activity: Not on file  Other Topics Concern   Not on file  Social History Narrative   Lives alone.  Works [employer].     Social Drivers of Corporate investment banker Strain: Not on file  Food Insecurity: Not on file  Transportation  Needs: Not on file  Physical Activity: Not on file  Stress: Not on file  Social Connections: Not on file   Family History  Problem Relation Age of Onset   Cancer Father        lung        Objective:   Physical Exam Vitals reviewed.  Constitutional:      General: He is not in acute distress.    Appearance: He is well-developed.  HENT:     Head: Normocephalic.     Right Ear: Tympanic membrane and external ear normal.     Left Ear: Tympanic membrane and external ear normal.  Eyes:     General:        Right eye: No discharge.        Left eye: No discharge.     Pupils: Pupils are equal, round, and reactive to light.  Neck:     Thyroid: No thyromegaly.  Cardiovascular:     Rate and Rhythm: Normal rate and regular rhythm.     Heart sounds: Normal heart sounds. No murmur heard. Pulmonary:     Effort: Pulmonary effort is normal. No respiratory distress.     Breath sounds: Normal breath sounds. No wheezing.  Abdominal:     General: Bowel sounds are normal. There is no distension.  Palpations: Abdomen is soft.     Tenderness: There is no abdominal tenderness.  Musculoskeletal:        General: No tenderness. Normal range of motion.     Cervical back: Normal range of motion and neck supple.  Skin:    General: Skin is warm and dry.     Findings: No erythema or rash.  Neurological:     Mental Status: He is alert and oriented to person, place, and time.     Cranial Nerves: No cranial nerve deficit.     Deep Tendon Reflexes: Reflexes are normal and symmetric.  Psychiatric:        Behavior: Behavior normal.        Thought Content: Thought content normal.        Judgment: Judgment normal.       BP 91/62   Pulse (!) 113   Temp (!) 97 F (36.1 C) (Temporal)   Ht 6' (1.829 m)   Wt 244 lb (110.7 kg)   SpO2 93%   BMI 33.09 kg/m      Assessment & Plan:  Tamara A Markell comes in today with chief complaint of Hypertension (Patient walked in saying that his blood pressure  over the last week or so has been low 90's over 60. Patient states that he has been getting dizzy, feeling light headed, and feeling like he may pass out. )   Diagnosis and orders addressed:  1. Hypotension, unspecified hypotension type (Primary) Continue to hold lisinopril   Force fluids - CMP14+EGFR - CBC with Differential/Platelet  2. Weakness - CMP14+EGFR - CBC with Differential/Platelet  3. Diarrhea, unspecified type - CMP14+EGFR - CBC with Differential/Platelet  4. Dizzy  - CMP14+EGFR - CBC with Differential/Platelet  5. Dehydration  6. Tachycardia - EKG 12-Lead   Labs pending Hold lisinopril  20 mg   Force fluids Continue current medications  Imodium as needed for diarrhea Work note given  Diet and exercise encouraged  Return if symptoms worsen or fail to improve.    Tommas Fragmin, FNP

## 2024-04-05 NOTE — Patient Instructions (Signed)
 Hypotension As the heart beats, it forces blood through the body. Hypotension, commonly called low blood pressure, is when the force of blood pumping through the arteries is too weak. Arteries are blood vessels that carry blood from the heart throughout the body. Depending on the cause and severity, hypotension may be harmless (benign) or may cause serious problems (be critical). When your blood pressure is too low, you may not get enough blood to your brain or to the rest of your organs. This can cause weakness, light-headedness, a rapid heartbeat, and fainting. What are the causes? This condition may be caused by: Blood loss. Loss of body fluids (dehydration). Heart problems. Hormone (endocrine) problems. Pregnancy. Severe infection. Lack of certain nutrients. Severe allergic reactions (anaphylaxis). Certain medicines, such as blood pressure medicine or medicines that make the body lose excess fluids (diuretics). Sometimes, hypotension may be caused by not taking medicine as directed, such as taking too much of a certain medicine. What increases the risk? The following factors may make you more likely to develop this condition: Age. Risk increases as you get older. Having a condition that affects the heart or the central nervous system. What are the signs or symptoms? Common symptoms of this condition include: Weakness. Light-headedness. Dizziness. Blurred vision. Tiredness (fatigue). Rapid heartbeat. Fainting, in severe cases. How is this diagnosed? This condition is diagnosed based on: Your medical history. Your symptoms. Your blood pressure measurement. Your health care provider will check your blood pressure when you are: Lying down. Sitting. Standing. A blood pressure reading is recorded as two numbers, such as "120 over 80" (or 120/80). The first ("top") number is called the systolic pressure. It is a measure of the pressure in your arteries as your heart beats. The second  ("bottom") number is called the diastolic pressure. It is a measure of the pressure in your arteries when your heart relaxes between beats. Blood pressure is measured in a unit called mm Hg. Healthy blood pressure for most adults is 120/80. If your blood pressure is below 90/60, you may be diagnosed with hypotension. Other information or tests that may be used to diagnose hypotension include: Your other vital signs, such as your heart rate and temperature. Blood tests. Tilt table test. For this test, you will be safely secured to a table that moves you from a lying position to an upright position. Your heart rhythm and blood pressure will be monitored during the test. How is this treated? Treatment for this condition may include: Changing your diet. This may involve drinking more water or increasing your salt (sodium) intake with high-sodium foods. Taking medicines to raise your blood pressure. Changing the dosage of certain medicines you are taking that might be lowering your blood pressure. Wearing compression stockings. These stockings help to prevent blood clots and reduce swelling in your legs. In some cases, you may need to go to the hospital for: Fluid replacement. This means you will receive fluids through an IV. Blood replacement. This means you will receive donated blood through an IV (transfusion). Treating an infection or heart problems, if this applies. Monitoring. You may need to be monitored while medicines that you are taking wear off. Follow these instructions at home: Eating and drinking  Drink enough fluid to keep your urine pale yellow. Eat a healthy diet, and follow instructions from your health care provider about eating or drinking restrictions. A healthy diet includes: Fresh fruits and vegetables. Whole grains. Lean meats. Low-fat dairy products. Increase your salt intake if told  to do so. Do not add extra salt to your diet unless your health care provider tells you  to do that. Eat frequent, small meals. Avoid standing up suddenly after eating. Medicines Take over-the-counter and prescription medicines only as told by your health care provider. Follow instructions from your health care provider about changing the dosage of your current medicines, if this applies. Do not stop or adjust any of your medicines on your own. General instructions  Wear compression stockings as told by your health care provider. Get up slowly from lying down or sitting positions. This gives your blood pressure a chance to adjust. Avoid hot showers and excessive heat as directed by your health care provider. Return to your normal activities as told by your health care provider. Ask your health care provider what activities are safe for you. Do not use any products that contain nicotine or tobacco. These products include cigarettes, chewing tobacco, and vaping devices, such as e-cigarettes. If you need help quitting, ask your health care provider. Keep all follow-up visits. This is important. Contact a health care provider if: You vomit. You have diarrhea. You have a fever for more than 2-3 days. You feel more thirsty than usual. You feel weak and tired. Get help right away if: You have chest pain. You have a fast or irregular heartbeat. You develop numbness in any part of your body. You cannot move your arms or your legs. You have trouble speaking. You become sweaty or feel light-headed. You faint. You feel short of breath. You have trouble staying awake. You feel confused. These symptoms may be an emergency. Get help right away. Call 911. Do not wait to see if the symptoms will go away. Do not drive yourself to the hospital. Summary Hypotension is when the force of blood pumping through the arteries is too weak. Hypotension may be harmless (benign) or may cause serious problems (be critical). Treatment for this condition may include changing your diet, changing  your medicines, and wearing compression stockings. In some cases, you may need to go to the hospital for fluid or blood replacement. This information is not intended to replace advice given to you by your health care provider. Make sure you discuss any questions you have with your health care provider. Document Revised: 06/03/2021 Document Reviewed: 06/03/2021 Elsevier Patient Education  2024 [employer].

## 2024-04-06 LAB — CBC WITH DIFFERENTIAL/PLATELET
Basophils Absolute: 0.1 10*3/uL (ref 0.0–0.2)
Basos: 1 %
EOS (ABSOLUTE): 0.1 10*3/uL (ref 0.0–0.4)
Eos: 2 %
Hematocrit: 45.9 % (ref 37.5–51.0)
Hemoglobin: 14.6 g/dL (ref 13.0–17.7)
Immature Grans (Abs): 0 10*3/uL (ref 0.0–0.1)
Immature Granulocytes: 0 %
Lymphocytes Absolute: 1.3 10*3/uL (ref 0.7–3.1)
Lymphs: 17 %
MCH: 28.9 pg (ref 26.6–33.0)
MCHC: 31.8 g/dL (ref 31.5–35.7)
MCV: 91 fL (ref 79–97)
Monocytes Absolute: 0.6 10*3/uL (ref 0.1–0.9)
Monocytes: 8 %
Neutrophils Absolute: 5.4 10*3/uL (ref 1.4–7.0)
Neutrophils: 71 %
Platelets: 178 10*3/uL (ref 150–450)
RBC: 5.05 x10E6/uL (ref 4.14–5.80)
RDW: 13.4 % (ref 11.6–15.4)
WBC: 7.5 10*3/uL (ref 3.4–10.8)

## 2024-04-06 LAB — CMP14+EGFR
ALT: 118 IU/L — ABNORMAL HIGH (ref 0–44)
AST: 435 IU/L — ABNORMAL HIGH (ref 0–40)
Albumin: 4.5 g/dL (ref 4.1–5.1)
Alkaline Phosphatase: 151 IU/L — ABNORMAL HIGH (ref 44–121)
BUN/Creatinine Ratio: 10 (ref 9–20)
BUN: 29 mg/dL — ABNORMAL HIGH (ref 6–20)
Bilirubin Total: 1.1 mg/dL (ref 0.0–1.2)
CO2: 16 mmol/L — ABNORMAL LOW (ref 20–29)
Calcium: 9.8 mg/dL (ref 8.7–10.2)
Chloride: 90 mmol/L — ABNORMAL LOW (ref 96–106)
Creatinine, Ser: 3.01 mg/dL (ref 0.76–1.27)
Globulin, Total: 2.9 g/dL (ref 1.5–4.5)
Glucose: 116 mg/dL — ABNORMAL HIGH (ref 70–99)
Potassium: 3.9 mmol/L (ref 3.5–5.2)
Sodium: 128 mmol/L — ABNORMAL LOW (ref 134–144)
Total Protein: 7.4 g/dL (ref 6.0–8.5)
eGFR: 26 mL/min/{1.73_m2} — ABNORMAL LOW (ref >59)

## 2024-04-07 ENCOUNTER — Other Ambulatory Visit: Payer: Self-pay | Admitting: Family

## 2024-04-07 ENCOUNTER — Emergency Department (HOSPITAL_COMMUNITY)
Admission: EM | Admit: 2024-04-07 | Discharge: 2024-04-07 | Disposition: A | Discharge status: Home / Self Care | Attending: Emergency Medicine | Admitting: Emergency Medicine

## 2024-04-07 ENCOUNTER — Ambulatory Visit: Payer: Self-pay | Admitting: Family

## 2024-04-07 ENCOUNTER — Other Ambulatory Visit: Payer: Self-pay

## 2024-04-07 ENCOUNTER — Encounter (HOSPITAL_COMMUNITY): Payer: Self-pay

## 2024-04-07 DIAGNOSIS — R197 Diarrhea, unspecified: Secondary | ICD-10-CM | POA: Insufficient documentation

## 2024-04-07 DIAGNOSIS — R748 Abnormal levels of other serum enzymes: Secondary | ICD-10-CM

## 2024-04-07 DIAGNOSIS — E860 Dehydration: Secondary | ICD-10-CM

## 2024-04-07 DIAGNOSIS — N179 Acute kidney failure, unspecified: Secondary | ICD-10-CM | POA: Insufficient documentation

## 2024-04-07 DIAGNOSIS — R799 Abnormal finding of blood chemistry, unspecified: Secondary | ICD-10-CM | POA: Diagnosis present

## 2024-04-07 LAB — BASIC METABOLIC PANEL WITH GFR
Anion gap: 13 (ref 5–15)
BUN: 26 mg/dL — ABNORMAL HIGH (ref 6–20)
CO2: 23 mmol/L (ref 22–32)
Calcium: 9.4 mg/dL (ref 8.9–10.3)
Chloride: 99 mmol/L (ref 98–111)
Creatinine, Ser: 1.26 mg/dL — ABNORMAL HIGH (ref 0.61–1.24)
GFR, Estimated: >60 mL/min (ref >60)
Glucose, Bld: 107 mg/dL — ABNORMAL HIGH (ref 70–99)
Potassium: 3.7 mmol/L (ref 3.5–5.1)
Sodium: 135 mmol/L (ref 135–145)

## 2024-04-07 LAB — CBC WITH DIFFERENTIAL/PLATELET
Abs Immature Granulocytes: 0.04 10*3/uL (ref 0.00–0.07)
Basophils Absolute: 0.1 10*3/uL (ref 0.0–0.1)
Basophils Relative: 1 %
Eosinophils Absolute: 0.1 10*3/uL (ref 0.0–0.5)
Eosinophils Relative: 2 %
HCT: 39.2 % (ref 39.0–52.0)
Hemoglobin: 13.2 g/dL (ref 13.0–17.0)
Immature Granulocytes: 1 %
Lymphocytes Relative: 34 %
Lymphs Abs: 1.6 10*3/uL (ref 0.7–4.0)
MCH: 30.5 pg (ref 26.0–34.0)
MCHC: 33.7 g/dL (ref 30.0–36.0)
MCV: 90.5 fL (ref 80.0–100.0)
Monocytes Absolute: 0.5 10*3/uL (ref 0.1–1.0)
Monocytes Relative: 11 %
Neutro Abs: 2.4 10*3/uL (ref 1.7–7.7)
Neutrophils Relative %: 51 %
Platelets: 134 10*3/uL — ABNORMAL LOW (ref 150–400)
RBC: 4.33 MIL/uL (ref 4.22–5.81)
RDW: 13.8 % (ref 11.5–15.5)
WBC: 4.7 10*3/uL (ref 4.0–10.5)
nRBC: 0 % (ref 0.0–0.2)

## 2024-04-07 LAB — HEPATIC FUNCTION PANEL
ALT: 111 U/L — ABNORMAL HIGH (ref 0–44)
AST: 261 U/L — ABNORMAL HIGH (ref 15–41)
Albumin: 3.9 g/dL (ref 3.5–5.0)
Alkaline Phosphatase: 93 U/L (ref 38–126)
Bilirubin, Direct: 0.2 mg/dL (ref 0.0–0.2)
Indirect Bilirubin: 1 mg/dL — ABNORMAL HIGH (ref 0.3–0.9)
Total Bilirubin: 1.2 mg/dL (ref 0.0–1.2)
Total Protein: 7.8 g/dL (ref 6.5–8.1)

## 2024-04-07 LAB — URINALYSIS, ROUTINE W REFLEX MICROSCOPIC
Bilirubin Urine: NEGATIVE
Glucose, UA: 50 mg/dL — AB
Hgb urine dipstick: NEGATIVE
Ketones, ur: NEGATIVE mg/dL
Leukocytes,Ua: NEGATIVE
Nitrite: NEGATIVE
Protein, ur: NEGATIVE mg/dL
Specific Gravity, Urine: 1.012 (ref 1.005–1.030)
pH: 6 (ref 5.0–8.0)

## 2024-04-07 LAB — MAGNESIUM: Magnesium: 2 mg/dL (ref 1.7–2.4)

## 2024-04-07 LAB — PHOSPHORUS: Phosphorus: 3.1 mg/dL (ref 2.5–4.6)

## 2024-04-07 LAB — TROPONIN I (HIGH SENSITIVITY): Troponin I (High Sensitivity): 4 ng/L (ref <18)

## 2024-04-07 MED ORDER — LACTATED RINGERS IV BOLUS
1000.0000 mL | Freq: Once | INTRAVENOUS | Status: AC
  Administered 2024-04-07: 1000 mL via INTRAVENOUS

## 2024-04-07 NOTE — ED Notes (Signed)
 ED Provider at bedside.

## 2024-04-07 NOTE — ED Triage Notes (Signed)
 Pt arrived via POV from home c/o abnormal labs from PCP appointment yesterday. Pt reports he was advised to go to the ER for acute kidney injury. Pt endorses diarrhea, and blood pressure problems recently and reports he is still able to micturate.

## 2024-04-07 NOTE — ED Notes (Signed)
 Pt ambulated to restroom independently to provide urine specimen at this time.

## 2024-04-07 NOTE — ED Notes (Signed)
 Pt provided discharge instructions and prescription information. Pt was given the opportunity to ask questions and questions were answered.

## 2024-04-07 NOTE — Discharge Instructions (Signed)
 Please follow-up closely with your primary care doctor on an outpatient basis for a recheck of your kidney function liver function.  It has greatly improved today.  Please continue good oral hydration.  Return to emergency department immediately for any new or worsening symptoms.

## 2024-04-07 NOTE — ED Provider Notes (Signed)
 [place] EMERGENCY DEPARTMENT AT [facility] Provider Note   CSN: [id number] Arrival date & time: 04/07/24  1058     Patient presents with: abnormal labs   Jeffery Peters is a 38 y.o. male.   Patient is a 38 year old male who presents to the emergency department secondary to abnormal lab results.  Patient was sent to the emergency department by his primary care doctor for elevated creatinine and acute kidney injury.  Patient notes that he did see his primary care doctor approximately 2 days ago secondary to low blood pressures.  It is noted that the patient was taken off of one of his blood pressure medications and primary care doctor did recommend increase fluid intake and salt intake.  Patient notes that he has been adhering to this.  He does note that he has continued to urinate at his baseline.  He denies any associated dizziness, lightheadedness or syncope.  He denies any chest pain, shortness of breath, palpitations, abdominal pain, nausea, vomiting.  He does admit to some diarrhea.        Prior to Admission medications   Medication Sig Start Date End Date Taking? Authorizing Provider  ALPRAZolam  (XANAX ) 0.5 MG tablet Take 1 tablet (0.5 mg total) by mouth 2 (two) times daily as needed for anxiety. 03/24/24   Tommas Fragmin A, FNP  hydrOXYzine  (VISTARIL ) 25 MG capsule Take 1 capsule (25 mg total) by mouth every 8 (eight) hours as needed. 03/24/24   Tommas Fragmin A, FNP  Ixekizumab (TALTZ) 80 MG/ML SOAJ Inject 1 Dose into the skin every 30 (thirty) days.    [provider]  lisinopril  (ZESTRIL ) 20 MG tablet Take 1 tablet (20 mg total) by mouth daily. 12/22/23   Yevette Hem, FNP  omeprazole  (PRILOSEC) 20 MG capsule TAKE 1 CAPSULE BY MOUTH EVERY DAY 03/22/24   Tommas Fragmin A, FNP  PARoxetine  (PAXIL ) 40 MG tablet Take 1 tablet (40 mg total) by mouth daily. 12/22/23   Tommas Fragmin A, FNP  rosuvastatin  (CRESTOR ) 10 MG tablet Take 1 tablet (10 mg total) by mouth  daily. 03/24/24   Yevette Hem, FNP    Allergies: Patient has no known allergies.    Review of Systems  Gastrointestinal:  Positive for diarrhea.  All other systems reviewed and are negative.   Updated Vital Signs BP 139/88   Pulse (!) 103   Temp (!) 97.5 F (36.4 C) (Oral)   Resp 18   Ht 6' (1.829 m)   Wt 110.7 kg   SpO2 93%   BMI 33.09 kg/m   Physical Exam Vitals and nursing note reviewed.  Constitutional:      General: He is not in acute distress.    Appearance: Normal appearance. He is not ill-appearing.  HENT:     Head: Normocephalic and atraumatic.     Nose: Nose normal.     Mouth/Throat:     Mouth: Mucous membranes are moist.   Eyes:     Extraocular Movements: Extraocular movements intact.     Conjunctiva/sclera: Conjunctivae normal.     Pupils: Pupils are equal, round, and reactive to light.    Cardiovascular:     Rate and Rhythm: Normal rate and regular rhythm.     Pulses: Normal pulses.     Heart sounds: Normal heart sounds. No murmur heard.    No gallop.  Pulmonary:     Effort: No respiratory distress.     Breath sounds: Normal breath sounds. No stridor. No wheezing,  rhonchi or rales.  Abdominal:     General: Abdomen is flat. Bowel sounds are normal. There is no distension.     Palpations: Abdomen is soft.     Tenderness: There is no abdominal tenderness. There is no guarding.   Musculoskeletal:        General: Normal range of motion.     Cervical back: Normal range of motion and neck supple. No rigidity or tenderness.     Right lower leg: No edema.     Left lower leg: No edema.   Skin:    General: Skin is warm and dry.     Findings: No bruising or rash.   Neurological:     General: No focal deficit present.     Mental Status: He is alert and oriented to person, place, and time. Mental status is at baseline.     Cranial Nerves: No cranial nerve deficit.     Sensory: No sensory deficit.     Motor: No weakness.     Coordination:  Coordination normal.     Gait: Gait normal.   Psychiatric:        Mood and Affect: Mood normal.        Behavior: Behavior normal.        Thought Content: Thought content normal.        Judgment: Judgment normal.     (all labs ordered are listed, but only abnormal results are displayed) Labs Reviewed  BASIC METABOLIC PANEL WITH GFR  HEPATIC FUNCTION PANEL  CBC WITH DIFFERENTIAL/PLATELET  URINALYSIS, ROUTINE W REFLEX MICROSCOPIC  MAGNESIUM  PHOSPHORUS  TROPONIN I (HIGH SENSITIVITY)    EKG: None  Radiology: No results found.   Procedures   Medications Ordered in the ED  lactated ringers  bolus 1,000 mL (has no administration in time range)                                    Medical Decision Making Amount and/or Complexity of Data Reviewed Labs: ordered.   This patient presents to the ED for concern of abnormal labs differential diagnosis includes kidney failure, hepatic failure, electrolyte derangement, urinary retention, hepatorenal syndrome, dehydration    Additional history obtained:  Additional history obtained from family External records from outside source obtained and reviewed including medical records   Lab Tests:  I Ordered, and personally interpreted labs.  The pertinent results include: No leukocytosis, no anemia, elevated creatinine but improved, elevated AST and ALT but improved, normal electrolytes, negative troponin, unremarkable urinalysis    Medicines ordered and prescription drug management:  I ordered medication including IV fluids for acute kidney injury Reevaluation of the patient after these medicines showed that the patient improved I have reviewed the patients home medicines and have made adjustments as needed   Problem List / ED Course:  Patient is doing well at this time and is stable for discharge home.  Discussed with patient about the need for continued good p.o. hydration on an outpatient basis as well as alcohol  cessation.  Patient's creatinine has greatly improved from his values checked a few days ago and do not suspect that admission is warranted at this time.  Physical exam is otherwise unremarkable.  Abdominal exam demonstrates no focal tenderness throughout.  Heart rate has improved to approximately 85 on reevaluation after IV fluids.  The need for close follow-up with his primary care doctor on an outpatient basis for  reevaluation and recheck of his labs was discussed.  Patient and mother voiced understanding to the plan and had no additional questions.  Strict turn precautions were provided for any new or worsening symptoms.   Social Determinants of Health:  None        Final diagnoses:  None    ED Discharge Orders     None          Jeffery Peters 04/07/24 1238    Jeffery Glisson, MD 04/08/24 1350

## 2024-04-12 ENCOUNTER — Telehealth: Payer: Self-pay | Admitting: Family

## 2024-04-12 NOTE — Telephone Encounter (Signed)
 Tried calling patient to make him aware that we can't schedule him for a lab appt without a lab order. If he can't get a lab order from ER then he will need to be seen here by a provider for ER Follow up so that our provider can order the labs.    Copied from CRM [id number]. Topic: Clinical - Request for Lab/Test Order >> Apr 11, 2024  1:40 PM DeAngela L wrote: Reason for CRM: Patient calling to schedule Lab appt after leaving ER and they want him to follow up with new labs to check  his labs again  Pt number [phone] (M) Tuesdays or  Wednesdays evenings work better for the patient

## 2024-04-13 ENCOUNTER — Telehealth: Payer: Self-pay

## 2024-04-13 ENCOUNTER — Ambulatory Visit: Admitting: Family Medicine

## 2024-04-13 ENCOUNTER — Encounter: Payer: Self-pay | Admitting: Family

## 2024-04-13 NOTE — Telephone Encounter (Signed)
 Copied from CRM [id number]. Topic: Clinical - Request for Lab/Test Order >> Apr 11, 2024  1:40 PM DeAngela L wrote: Reason for CRM: Patient calling to schedule Lab appt after leaving ER and they want him to follow up with new labs to check  his labs again  Pt number [phone] (M) Tuesdays or  Wednesdays evenings work better for the patient >> Apr 13, 2024 12:44 PM Alysia Jumbo S wrote: Patient calling to check the status of lab order request

## 2024-04-15 NOTE — Telephone Encounter (Signed)
 Called to speak with patient he needs a hospital follow up

## 2024-04-15 NOTE — Telephone Encounter (Signed)
 Appt made

## 2024-04-19 ENCOUNTER — Encounter: Payer: Self-pay | Admitting: Family

## 2024-04-19 ENCOUNTER — Ambulatory Visit (INDEPENDENT_AMBULATORY_CARE_PROVIDER_SITE_OTHER): Admitting: Family

## 2024-04-19 VITALS — BP 115/73 | HR 98 | Temp 96.6°F | Ht 72.0 in | Wt 239.4 lb

## 2024-04-19 DIAGNOSIS — N179 Acute kidney failure, unspecified: Secondary | ICD-10-CM

## 2024-04-19 DIAGNOSIS — E860 Dehydration: Secondary | ICD-10-CM

## 2024-04-19 DIAGNOSIS — I959 Hypotension, unspecified: Secondary | ICD-10-CM

## 2024-04-19 DIAGNOSIS — R42 Dizziness and giddiness: Secondary | ICD-10-CM

## 2024-04-19 DIAGNOSIS — Z09 Encounter for follow-up examination after completed treatment for conditions other than malignant neoplasm: Secondary | ICD-10-CM

## 2024-04-19 NOTE — Progress Notes (Signed)
 Subjective:    Patient ID: Jeffery Peters, male    DOB: [birth year], 38 y.o.   MRN: [id number]  Chief Complaint  Patient presents with   Hospitalization Follow-up    HPI Pt presents to the office today for hospital follow up. Pt was seen on 04/05/24 for dizziness and hypotension from diarrhea. Was told to hold his lisinopril  20 mg and force fluids. His creatinine was 3.01 and was sent to the ED on 04/07/24. Pt's kidneys had improved to 1.26.   His diarrhea has resolved and has been taking his lisinopril  most days. Denies any hypotension or dizziness now.    Review of Systems  All other systems reviewed and are negative.   Social History   Socioeconomic History   Marital status: Single    Spouse name: Not on file   Number of children: Not on file   Years of education: Not on file   Highest education level: Not on file  Occupational History   Occupation: cook  Tobacco Use   Smoking status: Heavy Smoker    Current packs/day: 1.50    Types: Cigarettes   Smokeless tobacco: Never  Vaping Use   Vaping status: Never Used  Substance and Sexual Activity   Alcohol use: Yes   Drug use: No   Sexual activity: Not Currently  Other Topics Concern   Not on file  Social History Narrative   Lives alone.  Works [employer].     Social Drivers of Corporate investment banker Strain: Not on file  Food Insecurity: Not on file  Transportation Needs: Not on file  Physical Activity: Not on file  Stress: Not on file  Social Connections: Not on file   Family History  Problem Relation Age of Onset   Cancer Father        lung        Objective:   Physical Exam Vitals reviewed.  Constitutional:      General: He is not in acute distress.    Appearance: He is well-developed.  HENT:     Head: Normocephalic.     Right Ear: Tympanic membrane and external ear normal.     Left Ear: Tympanic membrane and external ear normal.   Eyes:     General:        Right eye: No discharge.         Left eye: No discharge.     Pupils: Pupils are equal, round, and reactive to light.   Neck:     Thyroid: No thyromegaly.   Cardiovascular:     Rate and Rhythm: Normal rate and regular rhythm.     Heart sounds: Normal heart sounds. No murmur heard. Pulmonary:     Effort: Pulmonary effort is normal. No respiratory distress.     Breath sounds: Normal breath sounds. No wheezing.  Abdominal:     General: Bowel sounds are normal. There is no distension.     Palpations: Abdomen is soft.     Tenderness: There is no abdominal tenderness.   Musculoskeletal:        General: No tenderness. Normal range of motion.     Cervical back: Normal range of motion and neck supple.   Skin:    General: Skin is warm and dry.     Findings: No erythema or rash.   Neurological:     Mental Status: He is alert and oriented to person, place, and time.     Cranial Nerves: No cranial nerve deficit.  Deep Tendon Reflexes: Reflexes are normal and symmetric.   Psychiatric:        Behavior: Behavior normal.        Thought Content: Thought content normal.        Judgment: Judgment normal.       BP 115/73   Pulse 98   Temp (!) 96.6 F (35.9 C) (Temporal)   Ht 6' (1.829 m)   Wt 239 lb 6.4 oz (108.6 kg)   SpO2 95%   BMI 32.47 kg/m      Assessment & Plan:  Jeffery Peters comes in today with chief complaint of Hospitalization Follow-up   Diagnosis and orders addressed:  1. Dizzy (Primary) - CMP14+EGFR - CBC with Differential/Platelet  2. Hypotension, unspecified hypotension type - CMP14+EGFR - CBC with Differential/Platelet  3. Hospital discharge follow-up - CMP14+EGFR - CBC with Differential/Platelet  4. Dehydration - CMP14+EGFR - CBC with Differential/Platelet  5. AKI (acute kidney injury) (HCC) - CMP14+EGFR - CBC with Differential/Platelet   Labs pending Dizziness, hypotension, diarrhea resolved Hospital notes reviewed  Force fluids Continue to monitor BP at home Continue  current medications  Health Maintenance reviewed Diet and exercise encouraged Follow up if symptoms worsen or do not improve    Bari Learn, FNP

## 2024-04-19 NOTE — Patient Instructions (Signed)
 Acute Kidney Injury, Adult Acute kidney injury is a sudden decrease in the ability of the kidneys to do what they are supposed to do. The kidneys are a pair of organs that: Make urine. Make hormones. Keep the right amount of fluids and chemicals in the body. This condition ranges from mild to severe. Over time, it may turn into long-term (chronic) kidney disease. Finding and treating the injury early may keep it from turning into chronic kidney disease. What are the causes? Common causes of this condition include: A problem with blood flow to the kidneys. This may be caused by: Low blood pressure, shock, or severe dehydration. Severe blood loss. Heart and blood vessel disease. Severe burns. Liver disease. Direct damage to the kidneys. This may be caused by: Certain medicines or toxins. Kidney disease. Contrast dye used in imaging tests. An infection of the kidney or bloodstream. Problems from surgery. Trauma to the kidney area. Organ failure. This includes heart or liver failure. A sudden block in urine flow. This may be caused by: Cancer. Kidney stones. An enlarged prostate. What increases the risk? You may be more likely to develop this condition if: You are older than 40 years of age. You are male. You are in the hospital. You may be even more at risk if you are very sick. You have certain conditions. These may include: Chronic kidney or liver disease. Diabetes. Heart disease and heart failure. Lung disease. What are the signs or symptoms? This condition may not cause symptoms until it becomes severe. If it does, symptoms may include: Feeling very tired or having trouble staying awake. Nausea or vomiting. Swelling (edema) of the face, legs, ankles, or feet. Pain in your abdomen, back, or along the side of your back (flank). Urine changes. You may: Make little or no urine. Pass urine with a weak flow. Muscle twitches and cramps. These are most often in the  legs. Confusion or trouble focusing. Not feeling the urge to eat. Fever. How is this diagnosed? This condition may be diagnosed based on your symptoms and your medical history. You may have a physical exam done. You may also have tests, such as: Blood tests. Urine tests. Imaging tests. A kidney biopsy. This is when a sample of kidney tissue is removed and looked at under a microscope. How is this treated? Treatment depends on the cause and how severe the condition is. In mild cases, treatment may not be needed. The kidneys may heal on their own. In severe cases, treatment may include: Treating the cause of the kidney injury. This may mean that you have to change your medicines or the doses you take. Getting fluids through an IV tube. Having a flexible tube (catheter) put in. This tube will drain urine and prevent blockages. Trying to keep problems from starting. This may mean not using certain medicines or not having tests done that could cause more injury. In some cases, you may also need: Dialysis or continuous renal replacement therapy (CRRT). This treatment uses a machine to do the job of the kidneys. Surgery. This may be done to repair a damaged kidney. It could also be done to remove a blockage in the urinary tract. Follow these instructions at home: Medicines Take over-the-counter and prescription medicines only as told by your health care provider. Do not take new medicines unless approved by your health care provider. Many medicines can make kidney damage worse. Do not take vitamin or mineral supplements unless approved by your health care provider. Some of  these can make kidney damage worse. Lifestyle  Make changes to your diet as told by your health care provider. You may need to eat less protein. Get to, and stay at, a healthy weight. If you need help, ask your health care provider. Start or keep up an exercise plan. Exercise at least 30 minutes a day, 5 days a week. Do not  use any products that contain nicotine or tobacco. These products include cigarettes, chewing tobacco, and vaping devices, such as e-cigarettes. If you need help quitting, ask your health care provider. General instructions  Keep track of your blood pressure. Tell your health care provider if you notice any changes. Keep your vaccines up to date. Ask your health care provider which vaccines you need. Keep all follow-up visits. Your health care provider will need to monitor your kidneys. Where to find support American Association of Kidney Patients: https://www.miller-montoya.com/ American Kidney Fund: [place].com.au Where to find more information [employer]: kidney.org Medical Education Institute: LifeOptions: lifeoptions.org Kidney School: kidneyschool.org Contact a health care provider if: Your symptoms get worse. You have new symptoms, such as: Headaches. Skin that is darker or lighter than normal. Easy bruising. Feeling itchy. Hiccups. Lack of menstrual periods. You have a fever. Get help right away if: You have signs of severe kidney disease, such as: Chest pain. Shortness of breath. Seizures. You have pain or bleeding when you pass urine. You make little or no urine. These symptoms may be an emergency. Get help right away. Call 911. Do not wait to see if the symptoms will go away. Do not drive yourself to the hospital. This information is not intended to replace advice given to you by your health care provider. Make sure you discuss any questions you have with your health care provider. Document Revised: 05/02/2022 Document Reviewed: 05/02/2022 Elsevier Patient Education  2024 [employer].

## 2024-04-20 LAB — CMP14+EGFR
ALT: 47 IU/L — ABNORMAL HIGH (ref 0–44)
AST: 76 IU/L — ABNORMAL HIGH (ref 0–40)
Albumin: 4.6 g/dL (ref 4.1–5.1)
Alkaline Phosphatase: 88 IU/L (ref 44–121)
BUN/Creatinine Ratio: 15 (ref 9–20)
BUN: 14 mg/dL (ref 6–20)
Bilirubin Total: 0.2 mg/dL (ref 0.0–1.2)
CO2: 20 mmol/L (ref 20–29)
Calcium: 10 mg/dL (ref 8.7–10.2)
Chloride: 102 mmol/L (ref 96–106)
Creatinine, Ser: 0.93 mg/dL (ref 0.76–1.27)
Globulin, Total: 2.6 g/dL (ref 1.5–4.5)
Glucose: 89 mg/dL (ref 70–99)
Potassium: 4.5 mmol/L (ref 3.5–5.2)
Sodium: 142 mmol/L (ref 134–144)
Total Protein: 7.2 g/dL (ref 6.0–8.5)
eGFR: 107 mL/min/{1.73_m2} (ref >59)

## 2024-04-20 LAB — CBC WITH DIFFERENTIAL/PLATELET
Basophils Absolute: 0.1 10*3/uL (ref 0.0–0.2)
Basos: 2 %
EOS (ABSOLUTE): 0.1 10*3/uL (ref 0.0–0.4)
Eos: 3 %
Hematocrit: 39.7 % (ref 37.5–51.0)
Hemoglobin: 13.1 g/dL (ref 13.0–17.7)
Immature Grans (Abs): 0 10*3/uL (ref 0.0–0.1)
Immature Granulocytes: 0 %
Lymphocytes Absolute: 1.2 10*3/uL (ref 0.7–3.1)
Lymphs: 27 %
MCH: 31 pg (ref 26.6–33.0)
MCHC: 33 g/dL (ref 31.5–35.7)
MCV: 94 fL (ref 79–97)
Monocytes Absolute: 0.4 10*3/uL (ref 0.1–0.9)
Monocytes: 8 %
Neutrophils Absolute: 2.6 10*3/uL (ref 1.4–7.0)
Neutrophils: 60 %
Platelets: 281 10*3/uL (ref 150–450)
RBC: 4.23 x10E6/uL (ref 4.14–5.80)
RDW: 13.7 % (ref 11.6–15.4)
WBC: 4.3 10*3/uL (ref 3.4–10.8)

## 2024-04-21 ENCOUNTER — Ambulatory Visit: Payer: Self-pay | Admitting: Family

## 2024-06-28 ENCOUNTER — Ambulatory Visit: Admitting: Family

## 2024-06-30 ENCOUNTER — Encounter: Payer: Self-pay | Admitting: Family

## 2024-07-06 ENCOUNTER — Ambulatory Visit: Admitting: Nurse Practitioner

## 2024-07-07 ENCOUNTER — Encounter: Payer: Self-pay | Admitting: Family

## 2024-07-07 ENCOUNTER — Ambulatory Visit (INDEPENDENT_AMBULATORY_CARE_PROVIDER_SITE_OTHER): Admitting: Family

## 2024-07-07 VITALS — BP 112/76 | HR 112 | Temp 97.8°F | Ht 72.0 in | Wt 235.0 lb

## 2024-07-07 DIAGNOSIS — F1320 Sedative, hypnotic or anxiolytic dependence, uncomplicated: Secondary | ICD-10-CM | POA: Diagnosis not present

## 2024-07-07 DIAGNOSIS — L409 Psoriasis, unspecified: Secondary | ICD-10-CM

## 2024-07-07 DIAGNOSIS — I10 Essential (primary) hypertension: Secondary | ICD-10-CM

## 2024-07-07 DIAGNOSIS — F411 Generalized anxiety disorder: Secondary | ICD-10-CM

## 2024-07-07 DIAGNOSIS — Z79899 Other long term (current) drug therapy: Secondary | ICD-10-CM

## 2024-07-07 DIAGNOSIS — F17200 Nicotine dependence, unspecified, uncomplicated: Secondary | ICD-10-CM

## 2024-07-07 DIAGNOSIS — E669 Obesity, unspecified: Secondary | ICD-10-CM

## 2024-07-07 DIAGNOSIS — E785 Hyperlipidemia, unspecified: Secondary | ICD-10-CM

## 2024-07-07 DIAGNOSIS — K219 Gastro-esophageal reflux disease without esophagitis: Secondary | ICD-10-CM

## 2024-07-07 DIAGNOSIS — F332 Major depressive disorder, recurrent severe without psychotic features: Secondary | ICD-10-CM

## 2024-07-07 MED ORDER — ALPRAZOLAM 0.5 MG PO TABS: 0.5000 mg | ORAL_TABLET | Freq: Two times a day (BID) | ORAL | 2 refills | Status: DC | PRN

## 2024-07-07 MED ORDER — PAROXETINE HCL 40 MG PO TABS: 40.0000 mg | ORAL_TABLET | Freq: Every day | ORAL | 2 refills | Status: AC

## 2024-07-07 MED ORDER — OMEPRAZOLE 20 MG PO CPDR: 20.0000 mg | DELAYED_RELEASE_CAPSULE | Freq: Every day | ORAL | 1 refills | Status: DC

## 2024-07-07 NOTE — Progress Notes (Signed)
 Subjective:    Patient ID: Jeffery Peters, male    DOB: 1986-02-23, 38 y.o.   MRN: [id number]  Chief Complaint  Patient presents with   Medication Refill   PT presents to the office today for chronic follow up.   Pt has psoriasis and is followed by Dermatologists annually. States he has flare ups.   Hypertension This is a chronic problem. The current episode started more than 1 year ago. The problem has been resolved since onset. The problem is controlled. Associated symptoms include anxiety. Pertinent negatives include no malaise/fatigue, peripheral edema or shortness of breath. Risk factors for coronary artery disease include dyslipidemia and obesity. The current treatment provides moderate improvement.  Gastroesophageal Reflux He complains of belching, heartburn and a hoarse voice. This is a chronic problem. The current episode started in the past 7 days. The problem occurs occasionally. The symptoms are aggravated by smoking and certain foods. Risk factors include obesity. He has tried a PPI for the symptoms. The treatment provided moderate relief.  Anxiety Presents for follow-up visit. Symptoms include excessive worry, nervous/anxious behavior and restlessness. Patient reports no shortness of breath. Symptoms occur most days. The severity of symptoms is mild.    Hyperlipidemia This is a chronic problem. The current episode started more than 1 year ago. The problem is controlled. Recent lipid tests were reviewed and are normal. Exacerbating diseases include obesity. Pertinent negatives include no shortness of breath. Current antihyperlipidemic treatment includes statins. The current treatment provides moderate improvement of lipids. Risk factors for coronary artery disease include dyslipidemia, hypertension, male sex and a sedentary lifestyle.  Depression        This is a chronic problem.  The current episode started more than 1 year ago.   The problem occurs intermittently.  Associated  symptoms include restlessness.  Associated symptoms include no helplessness, no hopelessness and not sad.  Past treatments include SSRIs - Selective serotonin reuptake inhibitors.  Past medical history includes anxiety.   Nicotine Dependence Presents for follow-up visit. His urge triggers include company of smokers. The symptoms have been stable. He smokes 1 pack of cigarettes per day.      Review of Systems  Constitutional:  Negative for malaise/fatigue.  HENT:  Positive for hoarse voice.   Respiratory:  Negative for shortness of breath.   Gastrointestinal:  Positive for heartburn.  Psychiatric/Behavioral:  Positive for depression. The patient is nervous/anxious.   All other systems reviewed and are negative.  Family History  Problem Relation Age of Onset   Cancer Father        lung   Social History   Socioeconomic History   Marital status: Single    Spouse name: Not on file   Number of children: Not on file   Years of education: Not on file   Highest education level: Not on file  Occupational History   Occupation: cook  Tobacco Use   Smoking status: Heavy Smoker    Current packs/day: 1.50    Types: Cigarettes   Smokeless tobacco: Never  Vaping Use   Vaping status: Never Used  Substance and Sexual Activity   Alcohol use: Yes   Drug use: No   Sexual activity: Not Currently  Other Topics Concern   Not on file  Social History Narrative   Lives alone.  Works [employer].     Social Drivers of Corporate investment banker Strain: Not on file  Food Insecurity: Not on file  Transportation Needs: Not on file  Physical  Activity: Not on file  Stress: Not on file  Social Connections: Not on file       Objective:   Physical Exam Vitals reviewed.  Constitutional:      General: He is not in acute distress.    Appearance: He is well-developed. He is obese.  HENT:     Head: Normocephalic.     Right Ear: Tympanic membrane normal.     Left Ear: Tympanic membrane normal.   Eyes:     General:        Right eye: No discharge.        Left eye: No discharge.     Pupils: Pupils are equal, round, and reactive to light.  Neck:     Thyroid: No thyromegaly.  Cardiovascular:     Rate and Rhythm: Normal rate and regular rhythm.     Heart sounds: Normal heart sounds. No murmur heard. Pulmonary:     Effort: Pulmonary effort is normal. No respiratory distress.     Breath sounds: Normal breath sounds. No wheezing.  Abdominal:     General: Bowel sounds are normal. There is no distension.     Palpations: Abdomen is soft.     Tenderness: There is no abdominal tenderness.  Musculoskeletal:        General: No tenderness. Normal range of motion.     Cervical back: Normal range of motion and neck supple.  Skin:    General: Skin is warm and dry.     Findings: Rash present. No erythema.     Comments: Dry erythemas skin of bilateral hands  Neurological:     Mental Status: He is alert and oriented to person, place, and time.     Cranial Nerves: No cranial nerve deficit.     Deep Tendon Reflexes: Reflexes are normal and symmetric.  Psychiatric:        Behavior: Behavior normal.        Thought Content: Thought content normal.        Judgment: Judgment normal.       BP 112/76   Pulse (!) 112   Temp 97.8 F (36.6 C)   Ht 6' (1.829 m)   Wt 235 lb (106.6 kg)   SpO2 97%   BMI 31.87 kg/m      Assessment & Plan:  Jeffery Peters comes in today with chief complaint of Medication Refill   Diagnosis and orders addressed:  1. GAD (generalized anxiety disorder) - ALPRAZolam  (XANAX ) 0.5 MG tablet; Take 1 tablet (0.5 mg total) by mouth 2 (two) times daily as needed for anxiety.  Dispense: 60 tablet; Refill: 2 - PARoxetine  (PAXIL ) 40 MG tablet; Take 1 tablet (40 mg total) by mouth daily.  Dispense: 90 tablet; Refill: 2  2. Controlled substance agreement signed - ALPRAZolam  (XANAX ) 0.5 MG tablet; Take 1 tablet (0.5 mg total) by mouth 2 (two) times daily as needed for  anxiety.  Dispense: 60 tablet; Refill: 2  3. Benzodiazepine dependence (HCC) - ALPRAZolam  (XANAX ) 0.5 MG tablet; Take 1 tablet (0.5 mg total) by mouth 2 (two) times daily as needed for anxiety.  Dispense: 60 tablet; Refill: 2  4. Gastroesophageal reflux disease without esophagitis - omeprazole  (PRILOSEC) 20 MG capsule; Take 1 capsule (20 mg total) by mouth daily.  Dispense: 90 capsule; Refill: 1  5. Severe episode of recurrent major depressive disorder, without psychotic features (HCC) - PARoxetine  (PAXIL ) 40 MG tablet; Take 1 tablet (40 mg total) by mouth daily.  Dispense: 90 tablet; Refill:  2  6. Essential hypertension (Primary)  7. Obesity (BMI 30-39.9)  8. Psoriasis   9. Current smoker  10. Hyperlipidemia, unspecified hyperlipidemia type   Labs pending Patient reviewed in  controlled database, no flags noted. Contract and drug screen are up to date.  Continue current medications, keep follow up with specialists Health Maintenance reviewed Diet and exercise encouraged  Follow up plan: 3 months    Bari Learn, FNP

## 2024-07-07 NOTE — Patient Instructions (Signed)

## 2024-10-11 ENCOUNTER — Ambulatory Visit: Payer: Self-pay | Admitting: Family

## 2024-10-11 ENCOUNTER — Encounter: Payer: Self-pay | Admitting: Family

## 2024-10-11 VITALS — BP 125/85 | HR 102 | Temp 97.3°F | Ht 72.0 in | Wt 251.2 lb

## 2024-10-11 DIAGNOSIS — F332 Major depressive disorder, recurrent severe without psychotic features: Secondary | ICD-10-CM

## 2024-10-11 DIAGNOSIS — F1320 Sedative, hypnotic or anxiolytic dependence, uncomplicated: Secondary | ICD-10-CM

## 2024-10-11 DIAGNOSIS — I10 Essential (primary) hypertension: Secondary | ICD-10-CM

## 2024-10-11 DIAGNOSIS — E785 Hyperlipidemia, unspecified: Secondary | ICD-10-CM

## 2024-10-11 DIAGNOSIS — K219 Gastro-esophageal reflux disease without esophagitis: Secondary | ICD-10-CM

## 2024-10-11 DIAGNOSIS — E669 Obesity, unspecified: Secondary | ICD-10-CM

## 2024-10-11 DIAGNOSIS — F410 Panic disorder [episodic paroxysmal anxiety] without agoraphobia: Secondary | ICD-10-CM

## 2024-10-11 DIAGNOSIS — F17200 Nicotine dependence, unspecified, uncomplicated: Secondary | ICD-10-CM

## 2024-10-11 DIAGNOSIS — F411 Generalized anxiety disorder: Secondary | ICD-10-CM

## 2024-10-11 DIAGNOSIS — L409 Psoriasis, unspecified: Secondary | ICD-10-CM

## 2024-10-11 DIAGNOSIS — Z79899 Other long term (current) drug therapy: Secondary | ICD-10-CM

## 2024-10-11 MED ORDER — ALPRAZOLAM 0.5 MG PO TABS: 0.5000 mg | ORAL_TABLET | Freq: Two times a day (BID) | ORAL | 2 refills | Status: AC | PRN

## 2024-10-11 MED ORDER — OMEPRAZOLE 20 MG PO CPDR: 20.0000 mg | DELAYED_RELEASE_CAPSULE | Freq: Every day | ORAL | 1 refills | Status: AC

## 2024-10-11 MED ORDER — ROSUVASTATIN CALCIUM 10 MG PO TABS: 10.0000 mg | ORAL_TABLET | Freq: Every day | ORAL | 2 refills | Status: AC

## 2024-10-11 MED ORDER — HYDROXYZINE PAMOATE 25 MG PO CAPS: 25.0000 mg | ORAL_CAPSULE | Freq: Three times a day (TID) | ORAL | 0 refills | Status: AC | PRN

## 2024-10-11 MED ORDER — LISINOPRIL 20 MG PO TABS: 20.0000 mg | ORAL_TABLET | Freq: Every day | ORAL | 3 refills | Status: AC

## 2024-10-11 NOTE — Patient Instructions (Signed)

## 2024-10-11 NOTE — Progress Notes (Signed)
 Subjective:    Patient ID: Jeffery Peters, male    DOB: [birth year], 38 y.o.   MRN: [id number]  Chief Complaint  Patient presents with   Medical Management of Chronic Issues   PT presents to the office today for chronic follow up.   Pt has psoriasis and is followed by Dermatologists annually. States he has flare ups.   Hypertension This is a chronic problem. The current episode started more than 1 year ago. The problem has been resolved since onset. The problem is controlled. Associated symptoms include anxiety. Pertinent negatives include no malaise/fatigue, peripheral edema or shortness of breath. Risk factors for coronary artery disease include dyslipidemia, obesity and male gender. The current treatment provides moderate improvement.  Gastroesophageal Reflux He complains of belching, heartburn and a hoarse voice. This is a chronic problem. The current episode started in the past 7 days. The problem occurs occasionally. The symptoms are aggravated by smoking and certain foods. Risk factors include obesity. He has tried a PPI for the symptoms. The treatment provided moderate relief.  Anxiety Presents for follow-up visit. Symptoms include excessive worry, nervous/anxious behavior and restlessness. Patient reports no shortness of breath. Symptoms occur occasionally. The severity of symptoms is mild.    Hyperlipidemia This is a chronic problem. The current episode started more than 1 year ago. The problem is controlled. Recent lipid tests were reviewed and are normal. Exacerbating diseases include obesity. Pertinent negatives include no shortness of breath. Current antihyperlipidemic treatment includes statins. The current treatment provides moderate improvement of lipids. Risk factors for coronary artery disease include dyslipidemia, hypertension, male sex, a sedentary lifestyle and obesity.  Depression        This is a chronic problem.  The current episode started more than 1 year ago.   The  problem occurs intermittently.  Associated symptoms include restlessness.  Associated symptoms include no helplessness, no hopelessness and not sad.  Past treatments include SSRIs - Selective serotonin reuptake inhibitors.  Past medical history includes anxiety.   Nicotine Dependence Presents for follow-up visit. The symptoms have been stable. He smokes 1 pack of cigarettes per day.      Review of Systems  Constitutional:  Negative for malaise/fatigue.  HENT:  Positive for hoarse voice.   Respiratory:  Negative for shortness of breath.   Gastrointestinal:  Positive for heartburn.  Psychiatric/Behavioral:  The patient is nervous/anxious.   All other systems reviewed and are negative.  Family History  Problem Relation Age of Onset   Cancer Father        lung   Social History   Socioeconomic History   Marital status: Single    Spouse name: Not on file   Number of children: Not on file   Years of education: Not on file   Highest education level: Not on file  Occupational History   Occupation: cook  Tobacco Use   Smoking status: Heavy Smoker    Current packs/day: 1.50    Types: Cigarettes   Smokeless tobacco: Never  Vaping Use   Vaping status: Never Used  Substance and Sexual Activity   Alcohol use: Yes   Drug use: No   Sexual activity: Not Currently  Other Topics Concern   Not on file  Social History Narrative   Lives alone.  Works [employer].     Social Drivers of Health   Tobacco Use: High Risk (10/11/2024)   Patient History    Smoking Tobacco Use: Heavy Smoker    Smokeless Tobacco Use: Never  Passive Exposure: Not on file  Financial Resource Strain: Not on file  Food Insecurity: Not on file  Transportation Needs: Not on file  Physical Activity: Not on file  Stress: Not on file  Social Connections: Not on file  Depression (PHQ2-9): Low Risk (10/11/2024)   Depression (PHQ2-9)    PHQ-2 Score: 0  Alcohol Screen: Not on file  Housing: Not on file  Utilities:  Not on file  Health Literacy: Not on file       Objective:   Physical Exam Vitals reviewed.  Constitutional:      General: He is not in acute distress.    Appearance: He is well-developed. He is obese.  HENT:     Head: Normocephalic.     Right Ear: Tympanic membrane normal.     Left Ear: Tympanic membrane normal.  Eyes:     General:        Right eye: No discharge.        Left eye: No discharge.     Pupils: Pupils are equal, round, and reactive to light.  Neck:     Thyroid: No thyromegaly.  Cardiovascular:     Rate and Rhythm: Normal rate and regular rhythm.     Heart sounds: Normal heart sounds. No murmur heard. Pulmonary:     Effort: Pulmonary effort is normal. No respiratory distress.     Breath sounds: Normal breath sounds. No wheezing.  Abdominal:     General: Bowel sounds are normal. There is no distension.     Palpations: Abdomen is soft.     Tenderness: There is no abdominal tenderness.  Musculoskeletal:        General: No tenderness. Normal range of motion.     Cervical back: Normal range of motion and neck supple.  Skin:    General: Skin is warm and dry.     Findings: Rash present. No erythema.     Comments: Dry erythemas skin of bilateral hands  Neurological:     Mental Status: He is alert and oriented to person, place, and time.     Cranial Nerves: No cranial nerve deficit.     Deep Tendon Reflexes: Reflexes are normal and symmetric.  Psychiatric:        Behavior: Behavior normal.        Thought Content: Thought content normal.        Judgment: Judgment normal.       BP 125/85   Pulse (!) 102   Temp (!) 97.3 F (36.3 C) (Temporal)   Ht 6' (1.829 m)   Wt 251 lb 3.2 oz (113.9 kg)   BMI 34.07 kg/m      Assessment & Plan:  Jeffery Peters comes in today with chief complaint of Medical Management of Chronic Issues   Diagnosis and orders addressed:  1. GAD (generalized anxiety disorder) - ALPRAZolam  (XANAX ) 0.5 MG tablet; Take 1 tablet (0.5 mg  total) by mouth 2 (two) times daily as needed for anxiety.  Dispense: 60 tablet; Refill: 2 - hydrOXYzine  (VISTARIL ) 25 MG capsule; Take 1 capsule (25 mg total) by mouth every 8 (eight) hours as needed.  Dispense: 270 capsule; Refill: 0 - ToxASSURE Select 13 (MW), Urine - CMP14+EGFR  2. Controlled substance agreement signed - ALPRAZolam  (XANAX ) 0.5 MG tablet; Take 1 tablet (0.5 mg total) by mouth 2 (two) times daily as needed for anxiety.  Dispense: 60 tablet; Refill: 2 - ToxASSURE Select 13 (MW), Urine - CMP14+EGFR  3. Benzodiazepine dependence (HCC) -  ALPRAZolam  (XANAX ) 0.5 MG tablet; Take 1 tablet (0.5 mg total) by mouth 2 (two) times daily as needed for anxiety.  Dispense: 60 tablet; Refill: 2 - CMP14+EGFR  4. Panic attack - hydrOXYzine  (VISTARIL ) 25 MG capsule; Take 1 capsule (25 mg total) by mouth every 8 (eight) hours as needed.  Dispense: 270 capsule; Refill: 0 - CMP14+EGFR  5. Essential hypertension - lisinopril  (ZESTRIL ) 20 MG tablet; Take 1 tablet (20 mg total) by mouth daily.  Dispense: 90 tablet; Refill: 3 - CMP14+EGFR  6. Gastroesophageal reflux disease without esophagitis - omeprazole  (PRILOSEC) 20 MG capsule; Take 1 capsule (20 mg total) by mouth daily.  Dispense: 90 capsule; Refill: 1 - CMP14+EGFR  7. Hyperlipidemia, unspecified hyperlipidemia type  - rosuvastatin  (CRESTOR ) 10 MG tablet; Take 1 tablet (10 mg total) by mouth daily.  Dispense: 90 tablet; Refill: 2 - CMP14+EGFR  8. Current smoker (Primary) - CMP14+EGFR  9. Severe episode of recurrent major depressive disorder, without psychotic features (HCC) - CMP14+EGFR  10. Obesity (BMI 30-39.9) - CMP14+EGFR  11. Psoriasis - CMP14+EGFR  Labs pending Patient reviewed in [place] controlled database, no flags noted. Contract and drug screen are up to date.  Continue current medications, keep follow up with specialists Health Maintenance reviewed Diet and exercise encouraged  Follow up plan: 3 months     Bari Learn, FNP

## 2024-10-12 LAB — CMP14+EGFR
ALT: 21 IU/L (ref 0–44)
AST: 47 IU/L — ABNORMAL HIGH (ref 0–40)
Albumin: 4.9 g/dL (ref 4.1–5.1)
Alkaline Phosphatase: 79 IU/L (ref 47–123)
BUN/Creatinine Ratio: 20 (ref 9–20)
BUN: 16 mg/dL (ref 6–24)
Bilirubin Total: 0.3 mg/dL (ref 0.0–1.2)
CO2: 18 mmol/L — ABNORMAL LOW (ref 20–29)
Calcium: 10.7 mg/dL — ABNORMAL HIGH (ref 8.7–10.2)
Chloride: 107 mmol/L — ABNORMAL HIGH (ref 96–106)
Creatinine, Ser: 0.79 mg/dL (ref 0.76–1.27)
Globulin, Total: 2.8 g/dL (ref 1.5–4.5)
Glucose: 105 mg/dL — ABNORMAL HIGH (ref 70–99)
Potassium: 4.9 mmol/L (ref 3.5–5.2)
Sodium: 148 mmol/L — ABNORMAL HIGH (ref 134–144)
Total Protein: 7.7 g/dL (ref 6.0–8.5)
eGFR: 115 mL/min/1.73 (ref >59)

## 2024-10-13 ENCOUNTER — Ambulatory Visit: Payer: Self-pay | Admitting: Family

## 2024-10-14 LAB — TOXASSURE SELECT 13 (MW), URINE

## 2025-01-17 ENCOUNTER — Ambulatory Visit: Admitting: Family
